# Patient Record
Sex: Female | Born: 1945 | Race: White | Hispanic: No | Marital: Married | Smoking: Former smoker
Health system: Southern US, Community
[De-identification: ages and names within clinical notes are randomized; demographics above are authoritative.]

## PROBLEM LIST (undated history)

## (undated) DIAGNOSIS — M810 Age-related osteoporosis without current pathological fracture: Secondary | ICD-10-CM

## (undated) HISTORY — PX: GALLBLADDER SURGERY: SHX652

## (undated) HISTORY — DX: Age-related osteoporosis without current pathological fracture: M81.0

## (undated) HISTORY — PX: ABDOMINAL HYSTERECTOMY: SHX81

---

## 1999-12-14 ENCOUNTER — Other Ambulatory Visit: Admission: RE | Admit: 1999-12-14 | Discharge: 1999-12-14 | Payer: Self-pay | Admitting: Podiatry

## 2000-09-09 ENCOUNTER — Encounter: Admission: RE | Admit: 2000-09-09 | Discharge: 2000-09-25 | Payer: Self-pay | Admitting: Family Medicine

## 2000-11-19 ENCOUNTER — Other Ambulatory Visit: Admission: RE | Admit: 2000-11-19 | Discharge: 2000-11-19 | Payer: Self-pay | Admitting: Obstetrics & Gynecology

## 2005-11-04 ENCOUNTER — Other Ambulatory Visit: Admission: RE | Admit: 2005-11-04 | Discharge: 2005-11-04 | Payer: Self-pay | Admitting: Obstetrics & Gynecology

## 2009-01-24 ENCOUNTER — Encounter: Admission: RE | Admit: 2009-01-24 | Discharge: 2009-03-21 | Payer: Self-pay | Admitting: Orthopedic Surgery

## 2013-01-04 ENCOUNTER — Other Ambulatory Visit: Payer: Self-pay | Admitting: Obstetrics & Gynecology

## 2013-01-04 DIAGNOSIS — R928 Other abnormal and inconclusive findings on diagnostic imaging of breast: Secondary | ICD-10-CM

## 2013-01-13 ENCOUNTER — Ambulatory Visit
Admission: RE | Admit: 2013-01-13 | Discharge: 2013-01-13 | Disposition: A | Discharge status: Home / Self Care | Payer: Medicare Other | Source: Ambulatory Visit | Attending: Obstetrics & Gynecology | Admitting: Obstetrics & Gynecology

## 2013-01-13 DIAGNOSIS — R928 Other abnormal and inconclusive findings on diagnostic imaging of breast: Secondary | ICD-10-CM

## 2013-07-21 ENCOUNTER — Other Ambulatory Visit: Payer: Self-pay | Admitting: Obstetrics & Gynecology

## 2013-07-21 DIAGNOSIS — N63 Unspecified lump in unspecified breast: Secondary | ICD-10-CM

## 2013-08-05 ENCOUNTER — Ambulatory Visit
Admission: RE | Admit: 2013-08-05 | Discharge: 2013-08-05 | Disposition: A | Discharge status: Home / Self Care | Payer: Medicare Other | Source: Ambulatory Visit | Attending: Obstetrics & Gynecology | Admitting: Obstetrics & Gynecology

## 2013-08-05 DIAGNOSIS — N63 Unspecified lump in unspecified breast: Secondary | ICD-10-CM

## 2013-10-14 ENCOUNTER — Encounter (INDEPENDENT_AMBULATORY_CARE_PROVIDER_SITE_OTHER): Payer: Self-pay

## 2013-10-14 ENCOUNTER — Ambulatory Visit (INDEPENDENT_AMBULATORY_CARE_PROVIDER_SITE_OTHER): Payer: Medicare Other | Admitting: *Deleted

## 2013-10-14 DIAGNOSIS — Z23 Encounter for immunization: Secondary | ICD-10-CM

## 2014-03-21 ENCOUNTER — Telehealth: Payer: Self-pay | Admitting: Family Medicine

## 2014-03-21 ENCOUNTER — Encounter: Payer: Self-pay | Admitting: Family Medicine

## 2014-03-21 ENCOUNTER — Ambulatory Visit (INDEPENDENT_AMBULATORY_CARE_PROVIDER_SITE_OTHER): Payer: Medicare Other | Admitting: Family Medicine

## 2014-03-21 VITALS — BP 108/62 | HR 76 | Temp 98.1°F | Ht 64.0 in | Wt 161.0 lb

## 2014-03-21 DIAGNOSIS — G5700 Lesion of sciatic nerve, unspecified lower limb: Secondary | ICD-10-CM

## 2014-03-21 MED ORDER — NAPROXEN 500 MG PO TABS: 500.0000 mg | ORAL_TABLET | Freq: Two times a day (BID) | ORAL | Status: DC

## 2014-03-21 NOTE — Patient Instructions (Signed)
Piriformis Syndrome with Rehab Piriformis syndrome is a condition the affects the nervous system in the area of the hip, and is characterized by pain and possibly a loss of feeling in the backside (posterior) thigh that may extend down the entire length of the leg. The symptoms are caused by an increase in pressure on the sciatic nerve by the piriformis muscle, which is on the back of the hip and is responsible for externally rotating the hip. The sciatic nerve and its branches connect to much of the leg. Normally the sciatic nerve runs between the piriformis muscle and other muscles. However, in certain individuals the nerve runs through the muscle, which causes an increase in pressure on the nerve and results in the symptoms of piriformis syndrome. SYMPTOMS   Pain, tingling, numbness, or burning in the back of the thigh that may also extend down the entire leg.  Occasionally, tenderness in the buttock.  Loss of function of the leg.  Pain that worsens when using the piriformis muscle (running, jumping, or stairs).  Pain that increases with prolonged sitting.  Pain that is lessened by laying flat on the back. CAUSES   Piriformis syndrome is the result of an increase in pressure placed on the sciatic nerve. Often times piriformis syndrome is an overuse injury.  Stress placed on the nerve from a sudden increase in the intensity, frequency, or duration of training.  Compensation of other extremity injuries. RISK INCREASES WITH:  Sports that involve the piriformis muscle (running, walking or jumping).  You are born with (congenital) a defect in which the sciatic nerve passes through the muscle. PREVENTION  Warm up and stretch properly before activity.  Allow for adequate recovery between workouts.  Maintain physical fitness:  Strength, flexibility, and endurance.  Cardiovascular fitness. PROGNOSIS  If treated properly, then the symptoms of piriformis syndrome usually resolve in 2  to 6 weeks. RELATED COMPLICATIONS   Persistent and possibly permanent pain and numbness in the lower extremity.  Weakness of the extremity that may progress to disability and inability to compete. TREATMENT  The most effective treatment for piriformis syndrome is rest from any activities that aggravate the symptoms. Ice and pain medication may help reduce pain and inflammation. The use of strengthening and stretching exercises may help reduce pain with activity. These exercises may be performed at home or with a therapist. A referral to a therapist may be given for further evaluation and treatment, such as ultrasound. Corticosteroid injections may be given to reduce inflammation that is causing pressure to be placed on the sciatic nerve. If non-surgical (conservative) treatment is unsuccessful, then surgery may be recommended.  MEDICATION   If pain medication is necessary, then nonsteroidal anti-inflammatory medications, such as aspirin and ibuprofen, or other minor pain relievers, such as acetaminophen, are often recommended.  Do not take pain medication for 7 days before surgery.  Prescription pain relievers may be given if deemed necessary by your caregiver. Use only as directed and only as much as you need.  Corticosteroid injections may be given by your caregiver. These injections should be reserved for the most serious cases, because they may only be given a certain number of times. HEAT AND COLD:   Cold treatment (icing) relieves pain and reduces inflammation. Cold treatment should be applied for 10 to 15 minutes every 2 to 3 hours for inflammation and pain and immediately after any activity that aggravates your symptoms. Use ice packs or massage the area with a piece of ice (ice massage).    Heat treatment may be used prior to performing the stretching and strengthening activities prescribed by your caregiver, physical therapist, or athletic trainer. Use a heat pack or soak the injury in  warm water. SEEK IMMEDIATE MEDICAL CARE IF:  Treatment seems to offer no benefit, or the condition worsens.  Any medications produce adverse side effects. EXERCISES RANGE OF MOTION (ROM) AND STRETCHING EXERCISES - Piriformis Syndrome These exercises may help you when beginning to rehabilitate your injury. Your symptoms may resolve with or without further involvement from your physician, physical therapist or athletic trainer. While completing these exercises, remember:   Restoring tissue flexibility helps normal motion to return to the joints. This allows healthier, less painful movement and activity.  An effective stretch should be held for at least 30 seconds.  A stretch should never be painful. You should only feel a gentle lengthening or release in the stretched tissue. STRETCH - Hip Rotators  Lie on your back on a firm surface. Grasp your right / left knee with your right / left hand and your ankle with your opposite hand.  Keeping your hips and shoulders firmly planted, gently pull your right / left knee and rotate your lower leg toward your opposite shoulder until you feel a stretch in your buttocks.  Hold this stretch for __________ seconds. Repeat this stretch __________ times. Complete this stretch __________ times per day. STRETCH  Iliotibial Band  On the floor or bed, lie on your side so your right / left leg is on top. Bend your knee and grab your ankle.  Slowly bring your knee back so that your thigh is in line with your trunk. Keep your heel at your buttocks and gently arch your back so your head, shoulders and hips line up.  Slowly lower your leg so that your knee approaches the floor/bed until you feel a gentle stretch on the outside of your right / left thigh. If you do not feel a stretch and your knee will not fall farther, place the heel of your opposite foot on top of your knee and pull your thigh down farther.  Hold this stretch for __________ seconds. Repeat  __________ times. Complete __________ times per day. STRENGTHENING EXERCISES - Piriformis Syndrome  These are some of the caregiver again or until your symptoms are resolved. Remember:   Strong muscles with good endurance tolerate stress better.  Do the exercises as initially prescribed by your caregiver. Progress slowly with each exercise, gradually increasing the number of repetitions and weight used under their guidance. STRENGTH - Hip Abductors, Straight Leg Raises Be aware of your form throughout the entire exercise so that you exercise the correct muscles. Sloppy form means that you are not strengthening the correct muscles.  Lie on your side so that your head, shoulders, knee and hip line up. You may bend your lower knee to help maintain your balance. Your right / left leg should be on top.  Roll your hips slightly forward, so that your hips are stacked directly over each other and your right / left knee is facing forward.  Lift your top leg up 4-6 inches, leading with your heel. Be sure that your foot does not drift forward or that your knee does not roll toward the ceiling.  Hold this position for __________ seconds. You should feel the muscles in your outer hip lifting (you may not notice this until your leg begins to tire).  Slowly lower your leg to the starting position. Allow the muscles to   fully relax before beginning the next repetition. Repeat __________ times. Complete this exercise __________ times per day.  STRENGTH - Hip Abductors, Quadriped  On a firm, lightly padded surface, position yourself on your hands and knees. Your hands should be directly below your shoulders and your knees should be directly below your hips.  Keeping your right / left knee bent, lift your leg out to the side. Keep your legs level and in line with your shoulders.  Position yourself on your hands and knees.  Hold for __________ seconds.  Keeping your trunk steady and your hips level, slowly  lower your leg to the starting position. Repeat __________ times. Complete this exercise __________ times per day.  STRENGTH - Hip Abductors, Standing  Tie one end of a rubber exercise band/tubing to a secure surface (table, pole) and tie a loop at the other end.  Place the loop around your right / left ankle. Keeping your ankle with the band directly opposite of the secured end, step away until there is tension in the tube/band.  Hold onto a chair as needed for balance.  Keeping your back upright, your shoulders over your hips, and your toes pointing forward, lift your right / left leg out to your side. Be sure to lift your leg with your hip muscles. Do not "throw" your leg or tip your body to lift your leg.  Slowly and with control, return to the starting position. Repeat exercise __________ times. Complete this exercise __________ times per day.  Document Released: 11/25/2005 Document Revised: 05/26/2012 Document Reviewed: 03/09/2009 [employer] Patient Information 2014 [employer]  

## 2014-03-21 NOTE — Progress Notes (Signed)
   Subjective:    Patient ID: [name], female    DOB: [birth year], 68 y.o.   MRN: [id number]  HPI This 68 y.o. female presents for evaluation of right hip pain and discomfort.  She c/o discomfort when She moves her right foot out laterally and internally.   Review of Systems C/o right hip pain   No chest pain, SOB, HA, dizziness, vision change, N/V, diarrhea, constipation, dysuria, urinary urgency or frequency or rash.  Objective:   Physical Exam  Vital signs noted  Well developed well nourished female.  HEENT - Head atraumatic Normocephalic Respiratory - Lungs CTA bilateral Cardiac - RRR S1 and S2 without murmur MS - TTP right gluteal region with palp and discomfort with internal and external rotation of hip.      Assessment & Plan:  Piriformis syndrome - Plan: naproxen (NAPROSYN) 500 MG tablet po bid x 10 - 15 days And use heating pad.  Follow up prn  Lysbeth Penner FNP

## 2014-03-21 NOTE — Telephone Encounter (Signed)
APPT WITH BILL [facility] AT 1

## 2014-03-31 ENCOUNTER — Telehealth: Payer: Self-pay | Admitting: Family Medicine

## 2014-04-25 LAB — HM DEXA SCAN

## 2014-05-03 ENCOUNTER — Encounter: Payer: Self-pay | Admitting: Family Medicine

## 2014-05-03 ENCOUNTER — Ambulatory Visit (INDEPENDENT_AMBULATORY_CARE_PROVIDER_SITE_OTHER): Payer: Medicare Other | Admitting: Family Medicine

## 2014-05-03 VITALS — BP 114/69 | HR 104 | Temp 99.3°F | Ht 64.0 in | Wt 163.4 lb

## 2014-05-03 DIAGNOSIS — R059 Cough, unspecified: Secondary | ICD-10-CM

## 2014-05-03 DIAGNOSIS — J029 Acute pharyngitis, unspecified: Secondary | ICD-10-CM

## 2014-05-03 DIAGNOSIS — R05 Cough: Secondary | ICD-10-CM

## 2014-05-03 DIAGNOSIS — J209 Acute bronchitis, unspecified: Secondary | ICD-10-CM

## 2014-05-03 LAB — POCT INFLUENZA A/B
Influenza A, POC: NEGATIVE
Influenza B, POC: NEGATIVE

## 2014-05-03 LAB — POCT RAPID STREP A (OFFICE): Rapid Strep A Screen: NEGATIVE

## 2014-05-03 MED ORDER — AZITHROMYCIN 250 MG PO TABS: ORAL_TABLET | ORAL | Status: DC

## 2014-05-03 MED ORDER — METHYLPREDNISOLONE ACETATE 80 MG/ML IJ SUSP
80.0000 mg | Freq: Once | INTRAMUSCULAR | Status: AC
  Administered 2014-05-03: 80 mg via INTRAMUSCULAR

## 2014-05-03 MED ORDER — BENZONATATE 100 MG PO CAPS: 100.0000 mg | ORAL_CAPSULE | Freq: Three times a day (TID) | ORAL | Status: DC | PRN

## 2014-05-03 NOTE — Progress Notes (Signed)
   Subjective:    Patient ID: [name], female    DOB: 01/06/1946, 68 y.o.   MRN: [id number]  HPI This 68 y.o. female presents for evaluation of aches, uri sx's, cough, and malaise for 2 days.   Review of Systems No chest pain, SOB, HA, dizziness, vision change, N/V, diarrhea, constipation, dysuria, urinary urgency or frequency, myalgias, arthralgias or rash.     Objective:   Physical Exam  Vital signs noted  Well developed well nourished female.  HEENT - Head atraumatic Normocephalic                Eyes - PERRLA, Conjuctiva - clear Sclera- Clear EOMI                Ears - EAC's Wnl TM's Wnl Gross Hearing WNL                Nose - Nares patent                 Throat - oropharanx wnl Respiratory - Lungs CTA bilateral Cardiac - RRR S1 and S2 without murmur GI - Abdomen soft Nontender and bowel sounds active x 4 Extremities - No edema. Neuro - Grossly intact.  Results for orders placed in visit on 05/03/14  POCT INFLUENZA A/B      Result Value Ref Range   Influenza A, POC Negative     Influenza B, POC Negative    POCT RAPID STREP A (OFFICE)      Result Value Ref Range   Rapid Strep A Screen Negative  Negative      Assessment & Plan:  Sore throat - Plan: POCT Influenza A/B, POCT rapid strep A, benzonatate (TESSALON PERLES) 100 MG capsule, azithromycin (ZITHROMAX) 250 MG tablet, methylPREDNISolone acetate (DEPO-MEDROL) injection 80 mg  Acute bronchitis - Plan: benzonatate (TESSALON PERLES) 100 MG capsule, azithromycin (ZITHROMAX) 250 MG tablet, methylPREDNISolone acetate (DEPO-MEDROL) injection 80 mg  Push po fluids, rest, tylenol and motrin otc prn as directed for fever, arthralgias, and myalgias.  Follow up prn if sx's continue or persist.  Lysbeth Penner FNP

## 2015-01-17 DIAGNOSIS — Z01419 Encounter for gynecological examination (general) (routine) without abnormal findings: Secondary | ICD-10-CM | POA: Diagnosis not present

## 2015-01-17 DIAGNOSIS — Z1231 Encounter for screening mammogram for malignant neoplasm of breast: Secondary | ICD-10-CM | POA: Diagnosis not present

## 2015-01-17 DIAGNOSIS — N959 Unspecified menopausal and perimenopausal disorder: Secondary | ICD-10-CM | POA: Diagnosis not present

## 2015-08-24 ENCOUNTER — Encounter (INDEPENDENT_AMBULATORY_CARE_PROVIDER_SITE_OTHER): Payer: Self-pay

## 2015-08-24 ENCOUNTER — Ambulatory Visit (INDEPENDENT_AMBULATORY_CARE_PROVIDER_SITE_OTHER): Payer: Medicare Other | Admitting: Family Medicine

## 2015-08-24 ENCOUNTER — Encounter: Payer: Self-pay | Admitting: Family Medicine

## 2015-08-24 VITALS — BP 115/73 | HR 85 | Temp 97.3°F | Ht 64.0 in | Wt 155.4 lb

## 2015-08-24 DIAGNOSIS — Z23 Encounter for immunization: Secondary | ICD-10-CM | POA: Diagnosis not present

## 2015-08-24 DIAGNOSIS — Z0000 Encounter for general adult medical examination without abnormal findings: Secondary | ICD-10-CM

## 2015-08-24 NOTE — Progress Notes (Signed)
Subjective:    Marissa Gomez is a 69 y.o. female who presents for Medicare Annual/Subsequent preventive examination.  Preventive Screening-Counseling & Management  Tobacco History  Smoking status  . Former Smoker -- 0.50 packs/day  . Types: Cigarettes  . Start date: 03/21/1989  . Quit date: 03/21/2009  Smokeless tobacco  . Not on file     Problems Prior to Visit 1. Osteoporosis- needs DEXA this year but gets it done at GYN  Current Problems (verified) There are no active problems to display for this patient.   Medications Prior to Visit Current Outpatient Prescriptions on File Prior to Visit  Medication Sig Dispense Refill  . alendronate (FOSAMAX) 70 MG tablet Take 70 mg by mouth once a week. Take with a full glass of water on an empty stomach.     No current facility-administered medications on file prior to visit.    Current Medications (verified) Current Outpatient Prescriptions  Medication Sig Dispense Refill  . alendronate (FOSAMAX) 70 MG tablet Take 70 mg by mouth once a week. Take with a full glass of water on an empty stomach.     No current facility-administered medications for this visit.     Allergies (verified) Penicillins   PAST HISTORY  Family History Family History  Problem Relation Age of Onset  . Heart disease Father     Social History Social History  Substance Use Topics  . Smoking status: Former Smoker -- 0.50 packs/day    Types: Cigarettes    Start date: 03/21/1989    Quit date: 03/21/2009  . Smokeless tobacco: Not on file  . Alcohol Use: No     Are there smokers in your home (other than you)? No  Risk Factors Current exercise habits: The patient does not participate in regular exercise at present.  Dietary issues discussed: None, tries to eat veggies   Cardiac risk factors: advanced age (older than 53 for men, 74 for women).  Depression Screen (Note: if answer to either of the following is "Yes", a more complete depression  screening is indicated)   Over the past two weeks, have you felt down, depressed or hopeless? No  Over the past two weeks, have you felt little interest or pleasure in doing things? No  Have you lost interest or pleasure in daily life? No  Do you often feel hopeless? No  Do you cry easily over simple problems? No  Activities of Daily Living In your present state of health, do you have any difficulty performing the following activities?:  Driving? No Managing money?  No Feeding yourself? No Getting from bed to chair? No Climbing a flight of stairs? No Preparing food and eating?: No Bathing or showering? No Getting dressed: No Getting to the toilet? No Using the toilet:No Moving around from place to place: No In the past year have you fallen or had a near fall?:No   Are you sexually active?  Yes  Do you have more than one partner?  No  Hearing Difficulties: No Do you often ask people to speak up or repeat themselves? No Do you experience ringing or noises in your ears? No Do you have difficulty understanding soft or whispered voices? No   Do you feel that you have a problem with memory? No  Do you often misplace items? No  Do you feel safe at home?  Yes  Cognitive Testing  Alert? Yes  Normal Appearance?Yes  Oriented to person? Yes  Place? Yes   Time? Yes  Recall  of three objects?  Yes  Can perform simple calculations? Yes  Displays appropriate judgment?Yes  Can read the correct time from a watch face?Yes   Advanced Directives have been discussed with the patient? Yes  List the Names of Other Physician/Practitioners you currently use: 1.    Indicate any recent Medical Services you may have received from other than [facility] providers in the past year (date may be approximate).  Immunization History  Administered Date(s) Administered  . Influenza,inj,Quad PF,36+ Mos 10/14/2013    Screening Tests Health Maintenance  Topic Date Due  . Hepatitis C Screening  [birth year]   . TETANUS/TDAP  01/29/1963  . ZOSTAVAX  01/30/2004  . DEXA SCAN  01/29/2009  . PNA vac Low Risk Adult (1 of 2 - PCV13) 01/29/2009  . INFLUENZA VACCINE  07/10/2015  . MAMMOGRAM  04/25/2016  . COLONOSCOPY  02/15/2024    All answers were reviewed with the patient and necessary referrals were made:  Kenn File, MD   08/24/2015   History reviewed: allergies, current medications, past family history, past medical history, past social history, past surgical history and problem list  Review of Systems Pertinent items are noted in HPI.    Objective:       Body mass index is 26.66 kg/(m^2). BP 115/73 mmHg  Pulse 85  Temp(Src) 97.3 F (36.3 C) (Oral)  Ht 5\' 4"  (1.626 m)  Wt 155 lb 6.4 oz (70.489 kg)  BMI 26.66 kg/m2  Gen: NAD, alert, cooperative with exam HEENT: NCAT,  L TM obscurred by cerumen, R TM normal with mod cerumen present CV: RRR, good S1/S2, no murmur Resp: CTABL, no wheezes, non-labored Ext: No edema, warm Neuro: Alert and oriented, No gross deficits      Assessment:     Marissa Gomez is a pleasant 69 year old female who sees her GYN for annual physicals. She has osteoipenia and is being treated with alendronate by her GYN. She has a follow up DEXA this year and will send [place] records.   Today she has impacted cerumen on teh L, She has debrox for this and will use it Difficulty sleeping- Bneadryl currently, encouraged melatonin      Plan:     During the course of the visit the patient was educated and counseled about appropriate screening and preventive services including:    Pneumococcal vaccine   advanced directive paperwork given  Diet review for nutrition referral? no   Patient Instructions (the written plan) was given to the patient.  Medicare Attestation I have personally reviewed: The patient's medical and social history Their use of alcohol, tobacco or illicit drugs Their current medications and supplements The patient's functional  ability including ADLs,fall risks, home safety risks, cognitive, and hearing and visual impairment Diet and physical activities Evidence for depression or mood disorders  The patient's weight, height, BMI, and visual acuity have been recorded in the chart.  I have made referrals, counseling, and provided education to the patient based on review of the above and I have provided the patient with a written personalized care plan for preventive services.     Kenn File, MD   08/24/2015

## 2015-08-24 NOTE — Patient Instructions (Signed)
For sleep try 5 mg of melatonin  Benadryl is also fine

## 2015-12-25 ENCOUNTER — Encounter: Payer: Self-pay | Admitting: Family Medicine

## 2015-12-25 ENCOUNTER — Ambulatory Visit (INDEPENDENT_AMBULATORY_CARE_PROVIDER_SITE_OTHER): Payer: Medicare Other | Admitting: Family Medicine

## 2015-12-25 VITALS — BP 104/65 | HR 89 | Temp 98.0°F | Ht 64.0 in | Wt 158.6 lb

## 2015-12-25 DIAGNOSIS — H6122 Impacted cerumen, left ear: Secondary | ICD-10-CM | POA: Diagnosis not present

## 2015-12-25 DIAGNOSIS — M533 Sacrococcygeal disorders, not elsewhere classified: Secondary | ICD-10-CM

## 2015-12-25 MED ORDER — MELOXICAM 7.5 MG PO TABS: 7.5000 mg | ORAL_TABLET | Freq: Every day | ORAL | Status: DC

## 2015-12-25 NOTE — Progress Notes (Signed)
BP 104/65 mmHg  Pulse 89  Temp(Src) 98 F (36.7 C) (Oral)  Ht 5\' 4"  (1.626 m)  Wt 158 lb 9.6 oz (71.94 kg)  BMI 27.21 kg/m2   Subjective:    Patient ID: Marissa Gomez, female    DOB: 1946/05/11, 70 y.o.   MRN: [id number]  HPI: Marissa Gomez is a 70 y.o. female presenting on 12/25/2015 for Lower back pain radiating into buttocks   HPI Left hip pain on the backside of her hip Patient has been having left hip pain on the backside of her hip for the past 2 months. It has been worsening over the past week to where she is having difficulty walking. It hurts more going upstairs and less going downstairs. She denies any fevers or chills or overlying skin changes. She has taken a couple Advil which may have helped. Rest definitely helps her get better. She had this once before in her right side but never on her left side.  Ear wax Patient has been complaining of significant ear wax problems on her left ear more than her right ear. She feels like it is muffled on her left ear and she has tried Diamox drops home without much success. She denies any significant drainage or purulence or pain associated with it. It is just more irritating and muffling of hearing.  Relevant past medical, surgical, family and social history reviewed and updated as indicated. Interim medical history since our last visit reviewed. Allergies and medications reviewed and updated.  Review of Systems  Constitutional: Negative for fever and chills.  HENT: Negative for congestion, ear discharge and ear pain.   Eyes: Negative for redness and visual disturbance.  Respiratory: Negative for chest tightness and shortness of breath.   Cardiovascular: Negative for chest pain, palpitations and leg swelling.  Genitourinary: Negative for dysuria and difficulty urinating.  Musculoskeletal: Positive for myalgias, arthralgias and gait problem. Negative for back pain and joint swelling.  Skin: Negative for rash.  Neurological:  Negative for light-headedness and headaches.  Psychiatric/Behavioral: Negative for behavioral problems and agitation.  All other systems reviewed and are negative.   Per HPI unless specifically indicated above     Medication List       This list is accurate as of: 12/25/15  2:31 PM.  Always use your most recent med list.               alendronate 70 MG tablet  Commonly known as:  FOSAMAX  Take 70 mg by mouth once a week. Take with a full glass of water on an empty stomach.     CALCIUM 1200 PO  Take by mouth.     CENTRUM SILVER ADULT 50+ PO  Take by mouth.     cholecalciferol 1000 units tablet  Commonly known as:  VITAMIN D  Take 1,000 Units by mouth daily.     meloxicam 7.5 MG tablet  Commonly known as:  MOBIC  Take 1 tablet (7.5 mg total) by mouth daily.     Red Yeast Rice 600 MG Caps  Take by mouth.           Objective:    BP 104/65 mmHg  Pulse 89  Temp(Src) 98 F (36.7 C) (Oral)  Ht 5\' 4"  (1.626 m)  Wt 158 lb 9.6 oz (71.94 kg)  BMI 27.21 kg/m2  Wt Readings from Last 3 Encounters:  12/25/15 158 lb 9.6 oz (71.94 kg)  08/24/15 155 lb 6.4 oz (70.489 kg)  05/03/14  163 lb 6.4 oz (74.118 kg)    Physical Exam  Constitutional: She is oriented to person, place, and time. She appears well-developed and well-nourished. No distress.  HENT:  Nose: Nose normal. Right sinus exhibits no maxillary sinus tenderness and no frontal sinus tenderness. Left sinus exhibits no maxillary sinus tenderness and no frontal sinus tenderness.  Mouth/Throat: Uvula is midline, oropharynx is clear and moist and mucous membranes are normal.  Cerumen impacted in left ear, significant cerumen in right ear as well but not impacted.  Eyes: Conjunctivae and EOM are normal. Pupils are equal, round, and reactive to light.  Cardiovascular: Normal rate, regular rhythm, normal heart sounds and intact distal pulses.   No murmur heard. Pulmonary/Chest: Effort normal and breath sounds normal. No  respiratory distress. She has no wheezes.  Musculoskeletal: Normal range of motion. She exhibits no edema or tenderness.       Left hip: Normal. She exhibits normal range of motion, normal strength, no tenderness, no bony tenderness, no swelling, no crepitus, no deformity and no laceration.       Back:  Neurological: She is alert and oriented to person, place, and time. Coordination normal.  Skin: Skin is warm and dry. No rash noted. She is not diaphoretic.  Psychiatric: She has a normal mood and affect. Her behavior is normal.  Nursing note and vitals reviewed.   Results for orders placed or performed in visit on 05/03/14  POCT Influenza A/B  Result Value Ref Range   Influenza A, POC Negative    Influenza B, POC Negative   POCT rapid strep A  Result Value Ref Range   Rapid Strep A Screen Negative Negative      Assessment & Plan:   Problem List Items Addressed This Visit    None    Visit Diagnoses    Cerumen impaction, left    -  Primary    Sacroiliac joint pain        Been going on for 2 months, will try Mobic and see if improves.    Relevant Medications    meloxicam (MOBIC) 7.5 MG tablet        Follow up plan: Return if symptoms worsen or fail to improve.  Counseling provided for all of the vaccine components No orders of the defined types were placed in this encounter.    Caryl Pina, MD [facility] 12/25/2015, 2:31 PM

## 2016-01-02 ENCOUNTER — Encounter: Payer: Self-pay | Admitting: *Deleted

## 2016-01-03 ENCOUNTER — Ambulatory Visit (INDEPENDENT_AMBULATORY_CARE_PROVIDER_SITE_OTHER): Payer: Medicare Other | Admitting: Family Medicine

## 2016-01-03 ENCOUNTER — Encounter: Payer: Self-pay | Admitting: Family Medicine

## 2016-01-03 VITALS — BP 105/68 | HR 86 | Temp 97.6°F | Ht 64.0 in | Wt 160.8 lb

## 2016-01-03 DIAGNOSIS — H6122 Impacted cerumen, left ear: Secondary | ICD-10-CM | POA: Diagnosis not present

## 2016-01-03 NOTE — Progress Notes (Signed)
BP 105/68 mmHg  Pulse 86  Temp(Src) 97.6 F (36.4 C) (Oral)  Ht 5\' 4"  (1.626 m)  Wt 160 lb 12.8 oz (72.938 kg)  BMI 27.59 kg/m2   Subjective:    Patient ID: Marissa Gomez, female    DOB: 01-Apr-1946, 70 y.o.   MRN: [id number]  HPI: Marissa Gomez is a 70 y.o. female presenting on 01/03/2016 for Ear wax removal   HPI Cerumen impaction Patient comes in today for a two-week follow-up on cerumen impaction. Her left ear is worse than her right. The last time she was here 2 weeks ago had difficulty getting it removed as it was so impacted and had her do 2 weeks of Debrox and come back today for a repeat washing and removal. She said her ears have still been muffled during this time but is improved slightly with the Debrox.  Relevant past medical, surgical, family and social history reviewed and updated as indicated. Interim medical history since our last visit reviewed. Allergies and medications reviewed and updated.  Review of Systems  Constitutional: Negative for fever and chills.  HENT: Positive for ear discharge. Negative for congestion and ear pain.   Eyes: Negative for redness and visual disturbance.  Respiratory: Negative for chest tightness and shortness of breath.   Cardiovascular: Negative for chest pain and leg swelling.  Genitourinary: Negative for dysuria and difficulty urinating.  Musculoskeletal: Negative for back pain and gait problem.  Skin: Negative for rash.  Neurological: Negative for light-headedness and headaches.  Psychiatric/Behavioral: Negative for behavioral problems and agitation.  All other systems reviewed and are negative.   Per HPI unless specifically indicated above     Medication List       This list is accurate as of: 01/03/16 10:50 AM.  Always use your most recent med list.               alendronate 70 MG tablet  Commonly known as:  FOSAMAX  Take 70 mg by mouth once a week. Take with a full glass of water on an empty stomach.     CALCIUM 1200 PO  Take by mouth.     CENTRUM SILVER ADULT 50+ PO  Take by mouth.     cholecalciferol 1000 units tablet  Commonly known as:  VITAMIN D  Take 1,000 Units by mouth daily.     meloxicam 7.5 MG tablet  Commonly known as:  MOBIC  Take 1 tablet (7.5 mg total) by mouth daily.     Red Yeast Rice 600 MG Caps  Take by mouth.           Objective:    BP 105/68 mmHg  Pulse 86  Temp(Src) 97.6 F (36.4 C) (Oral)  Ht 5\' 4"  (1.626 m)  Wt 160 lb 12.8 oz (72.938 kg)  BMI 27.59 kg/m2  Wt Readings from Last 3 Encounters:  01/03/16 160 lb 12.8 oz (72.938 kg)  12/25/15 158 lb 9.6 oz (71.94 kg)  08/24/15 155 lb 6.4 oz (70.489 kg)    Physical Exam  Constitutional: She is oriented to person, place, and time. She appears well-developed and well-nourished. No distress.  HENT:  Right Ear: Tympanic membrane, external ear and ear canal normal.  Left Ear: Tympanic membrane and external ear normal.  Examined after washing and lavage. Minimal wax left in left ear canal right is completely clear. Able to visualize tympanic membrane completely. No signs of infection.  Eyes: Conjunctivae and EOM are normal. Pupils are equal, round, and  reactive to light.  Cardiovascular: Normal rate, regular rhythm, normal heart sounds and intact distal pulses.   No murmur heard. Pulmonary/Chest: Effort normal and breath sounds normal. No respiratory distress. She has no wheezes.  Musculoskeletal: Normal range of motion. She exhibits no edema or tenderness.  Neurological: She is alert and oriented to person, place, and time. Coordination normal.  Skin: Skin is warm and dry. No rash noted. She is not diaphoretic.  Psychiatric: She has a normal mood and affect. Her behavior is normal.  Nursing note and vitals reviewed.   Results for orders placed or performed in visit on 01/02/16  HM DEXA SCAN  Result Value Ref Range   HM Dexa Scan No change    Cerumen disimpaction performed by the nurse, lavage. He  tolerated well and no side effects.    Assessment & Plan:       Problem List Items Addressed This Visit    None    Visit Diagnoses    Cerumen impaction, left    -  Primary    Patient was here for cerumen disimpaction 2 weeks ago, did not work, did 2 weeks of Debrox and will clean today        Follow up plan: Return if symptoms worsen or fail to improve.  Counseling provided for all of the vaccine components No orders of the defined types were placed in this encounter.    Caryl Pina, MD [facility] 01/03/2016, 10:50 AM

## 2016-01-25 ENCOUNTER — Telehealth: Payer: Self-pay | Admitting: Family Medicine

## 2016-01-25 DIAGNOSIS — M2550 Pain in unspecified joint: Secondary | ICD-10-CM

## 2016-01-25 NOTE — Telephone Encounter (Signed)
Patient aware referral has been made  

## 2016-01-25 NOTE — Telephone Encounter (Signed)
Go ahead and do a referral to orthopedics

## 2016-02-02 DIAGNOSIS — M533 Sacrococcygeal disorders, not elsewhere classified: Secondary | ICD-10-CM | POA: Diagnosis not present

## 2016-02-08 ENCOUNTER — Ambulatory Visit: Payer: Medicare Other | Attending: Physician Assistant | Admitting: Physical Therapy

## 2016-02-08 DIAGNOSIS — M533 Sacrococcygeal disorders, not elsewhere classified: Secondary | ICD-10-CM | POA: Diagnosis not present

## 2016-02-08 NOTE — Therapy (Signed)
[facility] [place], [place], [address] Phone: [phone]   Fax:  [phone]  Physical Therapy Evaluation  Patient Details  Name: Marissa Gomez MRN: [id number] Date of Birth: 08-14-1946 Referring Provider: Jeri Cos PA-C.  Encounter Date: 02/08/2016      PT End of Session - 02/08/16 1028    Visit Number 1   Number of Visits 12   Date for PT Re-Evaluation 03/21/16   PT Start Time 0947   PT Stop Time 1034   PT Time Calculation (min) 47 min   Activity Tolerance Patient tolerated treatment well   Behavior During Therapy [facility] for tasks assessed/performed      No past medical history on file.  Past Surgical History  Procedure Laterality Date  . Abdominal hysterectomy    . Gallbladder surgery      There were no vitals filed for this visit.  Visit Diagnosis:  Sacral back pain - Plan: PT plan of care cert/re-cert      Subjective Assessment - 02/08/16 0954    Subjective If I climb stairs my pain rises.   Limitations --  "Stairs."   Patient Stated Goals Get out of pain.   Pain Score 4    Pain Location Back   Pain Orientation Left   Pain Descriptors / Indicators Dull   Pain Type Acute pain   Pain Onset More than a month ago   Pain Frequency Constant   Aggravating Factors  Climbing stairs.   Pain Relieving Factors Lying down.            [facility] PT Assessment - 02/08/16 0001    Assessment   Medical Diagnosis Pain in left SIJ   Referring Provider Jeri Cos PA-C.   Onset Date/Surgical Date --  November 2016.   Hand Dominance Right   Next MD Visit PRN.   Precautions   Precautions None   Restrictions   Weight Bearing Restrictions No   Balance Screen   Has the patient fallen in the past 6 months No   Has the patient had a decrease in activity level because of a fear of falling?  No   Is the patient reluctant to leave their home because of a fear of falling?  No   Home Environment   Living Environment  Private residence   Prior Function   Level of Independence Independent   Posture/Postural Control   Posture Comments Left anterior pelvic tilt.  Left LE longer than right.   ROM / Strength   AROM / PROM / Strength AROM;Strength   AROM   Overall AROM Comments Normal spinal AROM.   Strength   Overall Strength Comments Normal bilateral LE strength.   Palpation   Palpation comment Very tender to palpation over patient's left SIJ.   Special Tests    Special Tests Lumbar   Lumbar Tests Straight Leg Raise  Norm LE DTR's (-) FABER testing.   Straight Leg Raise   Findings Negative   Ambulation/Gait   Gait Comments WNL.                   [facility] Adult PT Treatment/Exercise - 02/08/16 0001    Modalities   Modalities Electrical Stimulation;Moist Heat   Moist Heat Therapy   Number Minutes Moist Heat 15 Minutes   Moist Heat Location --  LB   Electrical Stimulation   Electrical Stimulation Location --  LB   Electrical Stimulation Action Constant pre-mod e'stim   Electrical Stimulation Parameters  80-150 HZ x 15 minutes.   Electrical Stimulation Goals Pain                PT Education - 02/19/16 1020    Education provided Yes   Person(s) Educated Patient   Methods Explanation;Demonstration;Handout;Verbal cues;Tactile cues   Comprehension Verbalized understanding;Returned demonstration          PT Short Term Goals - February 19, 2016 1026    PT SHORT TERM GOAL #1   Title Ind with an HEP.   Time 2   Period Weeks   Status New           PT Long Term Goals - 02-19-2016 1026    PT LONG TERM GOAL #1   Title Ind with an advanced core exercise program.   Time 4   Period Weeks   Status New   PT LONG TERM GOAL #2   Title Perform ADL's with pain not to exceed 2/10.   Time 4   Period Weeks   Status New   PT LONG TERM GOAL #3   Title Go up a flight of stairs with pain not > 2/10.   Time 4   Period Weeks   Status New               Plan - 2016-02-19 1023     Clinical Impression Statement The patient reports left sided low back pain since Novemeber 2016.  Her pain has improved since going on a steroid pack.  Her pain increases to 4/10 now when going up a flight of stairs.  Lying down decreases her pain.  I recommended she sleep with a pillow between her knees (she is a side sleeper.  She was shown 2 home exercises today.   Pt will benefit from skilled therapeutic intervention in order to improve on the following deficits Pain;Decreased activity tolerance   Rehab Potential Excellent   PT Frequency 3x / week   PT Duration 4 weeks   PT Treatment/Interventions ADLs/Self Care Home Management;Cryotherapy;Electrical Stimulation;Moist Heat;Therapeutic exercise;Therapeutic activities;Ultrasound;Manual techniques   PT Next Visit Plan Review HEP to equalize her leg lengths.  Prone over pillow Combo E'stim/U/S to patient left SIJ f/b STW/M; Back stabilization exercise to stab SIJ.   Consulted and Agree with Plan of Care Patient          G-Codes - 19-Feb-2016 1029    Functional Assessment Tool Used FOTO....36% limitation.   Functional Limitation Mobility: Walking and moving around   Mobility: Walking and Moving Around Current Status [phone]) At least 20 percent but less than 40 percent impaired, limited or restricted   Mobility: Walking and Moving Around Goal Status [phone]) At least 1 percent but less than 20 percent impaired, limited or restricted       Problem List There are no active problems to display for this patient.   Ifrah Vest, [place] MPT 02-19-2016, 10:34 AM  [facility] [address] [place], [place], [address] Phone: [phone]   Fax:  [phone]  Name: Marissa Gomez MRN: [id number] Date of Birth: 05/02/1946

## 2016-02-12 ENCOUNTER — Encounter: Payer: Self-pay | Admitting: Physical Therapy

## 2016-02-12 ENCOUNTER — Ambulatory Visit: Payer: Medicare Other | Admitting: Physical Therapy

## 2016-02-12 DIAGNOSIS — M533 Sacrococcygeal disorders, not elsewhere classified: Secondary | ICD-10-CM

## 2016-02-12 NOTE — Therapy (Signed)
[facility] [place], [place], [address] Phone: [phone]   Fax:  [phone]  Physical Therapy Treatment  Patient Details  Name: Marissa Gomez MRN: [id number] Date of Birth: [birth year] Referring Provider: Jeri Cos PA-C.  Encounter Date: 02/12/2016      PT End of Session - 02/12/16 0903    Visit Number 2   Number of Visits 12   Date for PT Re-Evaluation 03/21/16   PT Start Time 0903   PT Stop Time 0945   PT Time Calculation (min) 42 min   Activity Tolerance Patient tolerated treatment well   Behavior During Therapy [facility] for tasks assessed/performed      No past medical history on file.  Past Surgical History  Procedure Laterality Date  . Abdominal hysterectomy    . Gallbladder surgery      There were no vitals filed for this visit.  Visit Diagnosis:  Sacral back pain      Subjective Assessment - 02/12/16 0902    Subjective States that she felt pain getting out of car and moving certain way. Reports complaince with HEP.   Patient Stated Goals Get out of pain.   Currently in Pain? Yes   Pain Score 3    Pain Location Back   Pain Orientation Left;Lower   Pain Type Acute pain   Pain Onset More than a month ago            [facility] PT Assessment - 02/12/16 0001    Assessment   Medical Diagnosis Pain in left SIJ   Hand Dominance Right   Next MD Visit PRN.   Precautions   Precautions None   Restrictions   Weight Bearing Restrictions No                     [facility] Adult PT Treatment/Exercise - 02/12/16 0001    Modalities   Modalities Electrical Stimulation;Moist Heat;Ultrasound   Moist Heat Therapy   Number Minutes Moist Heat 15 Minutes   Moist Heat Location Lumbar Spine   Electrical Stimulation   Electrical Stimulation Location L Low back/ SIJ   Electrical Stimulation Action Pre-Mod   Electrical Stimulation Parameters 80-150 Hz x15 min   Electrical Stimulation Goals Pain    Ultrasound   Ultrasound Location L SI Joint   Ultrasound Parameters 1.5 w/cm2, 100%,1 mhz x10 min   Ultrasound Goals Pain   Manual Therapy   Manual Therapy Myofascial release   Myofascial Release MFR/STW to L SI joint and L lumbar paraspinals in prone over 2 pillows for comfort to decrease pain and soreness.                  PT Short Term Goals - 02/12/16 0934    PT SHORT TERM GOAL #1   Title Ind with an HEP.   Time 2   Period Weeks   Status Achieved           PT Long Term Goals - 02/12/16 0935    PT LONG TERM GOAL #1   Title Ind with an advanced core exercise program.   Time 4   Period Weeks   Status On-going   PT LONG TERM GOAL #2   Title Perform ADL's with pain not to exceed 2/10.   Time 4   Period Weeks   Status On-going   PT LONG TERM GOAL #3   Title Go up a flight of stairs with pain not > 2/10.   Time  4   Period Weeks   Status On-going               Plan - 02/12/16 0935    Clinical Impression Statement Patient tolerated today's treatment well with only complaints of soreness to palpation over the L SI joint. No abnormal tightness noted in L Glute and lumbar musculature today upon palpation with manual therapy. MFR was conducted primarily over the L SI joint today in prone over 2 pillows to assist in decreasing L SI joint inflammation and discomfort. Patient achieved ST HEP goal today upon report of complaince and all LT goals remain on-going secondary to pain with activities and not having started core strengthening program yet. Normal modalities response noted following removal of the modalities. Patient continues to have discomfort with standing following sitting and after driving for prolonged time per patient report although she states that steroid medication helped. Educated patient to continue HEP exercises and to try pillow between knees during sleep at night to further decrease L SI joint irritation as patient stated she had forgotten to try  pillow between knees during sleep. Denied L SI pain following today's treatment although she reported she was still tender to palpation at the L SI joint following today's treatment.   Pt will benefit from skilled therapeutic intervention in order to improve on the following deficits Pain;Decreased activity tolerance   Rehab Potential Excellent   PT Frequency 3x / week   PT Duration 4 weeks   PT Treatment/Interventions ADLs/Self Care Home Management;Cryotherapy;Electrical Stimulation;Moist Heat;Therapeutic exercise;Therapeutic activities;Ultrasound;Manual techniques   PT Next Visit Plan Continue with manual therapy and modalities over L SI Joint as symptoms dicate and progress with core strengthening per MPT POC.   Consulted and Agree with Plan of Care Patient        Problem List There are no active problems to display for this patient.   Wynelle Fanny, PTA 02/12/2016, 9:54 AM  [facility] [address] [place], [place], [address] Phone: [phone]   Fax:  [phone]  Name: Marissa Gomez MRN: [id number] Date of Birth: 1946-04-29

## 2016-02-15 ENCOUNTER — Ambulatory Visit: Payer: Medicare Other | Admitting: Physical Therapy

## 2016-02-15 ENCOUNTER — Encounter: Payer: Self-pay | Admitting: Physical Therapy

## 2016-02-15 DIAGNOSIS — M533 Sacrococcygeal disorders, not elsewhere classified: Secondary | ICD-10-CM | POA: Diagnosis not present

## 2016-02-15 NOTE — Therapy (Signed)
[facility] [place], [place], [address] Phone: [phone]   Fax:  [phone]  Physical Therapy Treatment  Patient Details  Name: Marissa Gomez MRN: [id number] Date of Birth: [birth year] Referring Provider: Jeri Cos PA-C.  Encounter Date: 02/15/2016      PT End of Session - 02/15/16 0933    Visit Number 3   Number of Visits 12   Date for PT Re-Evaluation 03/21/16   PT Start Time 0915   PT Stop Time 0957   PT Time Calculation (min) 42 min   Activity Tolerance Patient tolerated treatment well   Behavior During Therapy [facility] for tasks assessed/performed      History reviewed. No pertinent past medical history.  Past Surgical History  Procedure Laterality Date  . Abdominal hysterectomy    . Gallbladder surgery      There were no vitals filed for this visit.  Visit Diagnosis:  Sacral back pain      Subjective Assessment - 02/15/16 0928    Subjective patient reported some relief after last treatment   Patient Stated Goals Get out of pain.   Currently in Pain? Yes   Pain Score 3    Pain Location Back   Pain Orientation Lower;Left   Pain Descriptors / Indicators Sore   Pain Type Acute pain   Pain Onset More than a month ago   Pain Frequency Intermittent   Aggravating Factors  climbing stairs or getting up from a seated position   Pain Relieving Factors walking, rest                         [facility] Adult PT Treatment/Exercise - 02/15/16 0001    Moist Heat Therapy   Number Minutes Moist Heat 15 Minutes   Moist Heat Location Lumbar Spine   Electrical Stimulation   Electrical Stimulation Location L Low back/ SIJ   Electrical Stimulation Action premod   Electrical Stimulation Parameters 80-150hz    Electrical Stimulation Goals Pain   Ultrasound   Ultrasound Location Left SI Joint   Ultrasound Parameters 1.5w/cm2/100%/3mhx10min   Ultrasound Goals Pain   Manual Therapy   Manual Therapy  Myofascial release   Myofascial Release MFR/STW to L SI joint and L lumbar paraspinals i                  PT Short Term Goals - 02/12/16 0934    PT SHORT TERM GOAL #1   Title Ind with an HEP.   Time 2   Period Weeks   Status Achieved           PT Long Term Goals - 02/12/16 0935    PT LONG TERM GOAL #1   Title Ind with an advanced core exercise program.   Time 4   Period Weeks   Status On-going   PT LONG TERM GOAL #2   Title Perform ADL's with pain not to exceed 2/10.   Time 4   Period Weeks   Status On-going   PT LONG TERM GOAL #3   Title Go up a flight of stairs with pain not > 2/10.   Time 4   Period Weeks   Status On-going               Plan - 02/15/16 0949    Clinical Impression Statement Patient continues to progress with good response to treatment, patient has reported less soreness with palpation today. Patient doing HEP daily and is  ready to progress with core strengthening exercises to stabilize muscles in back. Pateint unable to meet any further goals due to pain deficits   Pt will benefit from skilled therapeutic intervention in order to improve on the following deficits Pain;Decreased activity tolerance   Rehab Potential Excellent   PT Frequency 3x / week   PT Duration 4 weeks   PT Treatment/Interventions ADLs/Self Care Home Management;Cryotherapy;Electrical Stimulation;Moist Heat;Therapeutic exercise;Therapeutic activities;Ultrasound;Manual techniques   PT Next Visit Plan Continue with modalities and  progress with core strengthening per MPT POC.   Consulted and Agree with Plan of Care Patient        Problem List There are no active problems to display for this patient.   Phillips Climes, PTA 02/15/2016, 10:04 AM  [facility] [place], [place], [address] Phone: [phone]   Fax:  [phone]  Name: Marissa Gomez MRN: [id number] Date of Birth: 1946-03-09

## 2016-02-19 ENCOUNTER — Encounter: Payer: Self-pay | Admitting: Physical Therapy

## 2016-02-19 ENCOUNTER — Ambulatory Visit: Payer: Medicare Other | Admitting: Physical Therapy

## 2016-02-19 DIAGNOSIS — M533 Sacrococcygeal disorders, not elsewhere classified: Secondary | ICD-10-CM

## 2016-02-19 NOTE — Patient Instructions (Signed)
Brushing Teeth    Place one foot on ledge and one hand on counter. Bend other knee slightly to keep back straight.  Copyright  VHI. All rights reserved.  Refrigerator   Squat with knees apart to reach lower shelves and drawers.   Copyright  VHI. All rights reserved.  Laundry [employer] down and hold basket close to stand. Use leg muscles to do the work.   Copyright  VHI. All rights reserved.  Housework - Vacuuming   Hold the vacuum with arm held at side. Step back and forth to move it, keeping head up. Avoid twisting.   Copyright  VHI. All rights reserved.  Housework - Wiping   Position yourself as close as possible to reach work surface. Avoid straining your back.   Copyright  VHI. All rights reserved.  Gardening - Mowing   Keep arms close to sides and walk with lawn mower.   Copyright  VHI. All rights reserved.  Sleeping on Side   Place pillow between knees. Use cervical support under neck and a roll around waist as needed.   Copyright  VHI. All rights reserved.  Log Roll   Lying on back, bend left knee and place left arm across chest. Roll all in one movement to the right. Reverse to roll to the left. Always move as one unit.   Copyright  VHI. All rights reserved.  Stand to Sit / Sit to Stand   To sit: Bend knees to lower self onto front edge of chair, then scoot back on seat. To stand: Reverse sequence by placing one foot forward, and scoot to front of seat. Use rocking motion to stand up.  Pelvic Tilt: Posterior - Legs Bent (Supine)   Tighten stomach and flatten back by rolling pelvis down. Hold _10___ seconds. Relax. Repeat _10-30___ times per set. Do __2__ sets per session. Do _2___ sessions per day.

## 2016-02-19 NOTE — Therapy (Signed)
[facility] [place], [place], [address] Phone: [phone]   Fax:  [phone]  Physical Therapy Treatment  Patient Details  Name: Marissa Gomez MRN: [id number] Date of Birth: [birth year] Referring Provider: Jeri Cos PA-C.  Encounter Date: 02/19/2016      PT End of Session - 02/19/16 0955    Visit Number 4   Number of Visits 12   Date for PT Re-Evaluation 03/21/16   PT Start Time 0915   PT Stop Time 0959   PT Time Calculation (min) 44 min   Activity Tolerance Patient tolerated treatment well   Behavior During Therapy [facility] for tasks assessed/performed      History reviewed. No pertinent past medical history.  Past Surgical History  Procedure Laterality Date  . Abdominal hysterectomy    . Gallbladder surgery      There were no vitals filed for this visit.  Visit Diagnosis:  Sacral back pain      Subjective Assessment - 02/19/16 0921    Subjective Patient had relief two days after treatment, then yesterday pain increased for unknown reason   Patient Stated Goals Get out of pain.   Currently in Pain? Yes   Pain Score 4    Pain Location Back   Pain Orientation Left;Lower   Pain Descriptors / Indicators Sore   Pain Type Acute pain   Pain Onset More than a month ago   Pain Frequency Intermittent   Aggravating Factors  climbing stairs or getting up from seated position   Pain Relieving Factors walking and rest                         [facility] Adult PT Treatment/Exercise - 02/19/16 0001    Self-Care   Self-Care ADL's;Lifting;Posture   Exercises   Exercises Lumbar   Lumbar Exercises: Supine   Ab Set 5 seconds  2x10   Bent Knee Raise 3 seconds  2x10   Moist Heat Therapy   Number Minutes Moist Heat 15 Minutes   Moist Heat Location Lumbar Spine   Electrical Stimulation   Electrical Stimulation Location L Low back/ SIJ   Electrical Stimulation Action premod   Electrical Stimulation  Parameters 80-150hz    Electrical Stimulation Goals Pain   Ultrasound   Ultrasound Location left SI Joint area   Ultrasound Parameters 1.5w/cm2/50%/27mhzx10min   Ultrasound Goals Pain                PT Education - 02/19/16 0948    Education provided Yes   Education Details core activation and posture techniques   Person(s) Educated Patient   Methods Explanation;Demonstration;Handout   Comprehension Verbalized understanding;Returned demonstration          PT Short Term Goals - 02/12/16 0934    PT SHORT TERM GOAL #1   Title Ind with an HEP.   Time 2   Period Weeks   Status Achieved           PT Long Term Goals - 02/12/16 0935    PT LONG TERM GOAL #1   Title Ind with an advanced core exercise program.   Time 4   Period Weeks   Status On-going   PT LONG TERM GOAL #2   Title Perform ADL's with pain not to exceed 2/10.   Time 4   Period Weeks   Status On-going   PT LONG TERM GOAL #3   Title Go up a flight of stairs with pain  not > 2/10.   Time 4   Period Weeks   Status On-going               Plan - 02/19/16 0956    Clinical Impression Statement Patient progressing with good understanding of core activation and posture awareness techniques today. Patient reported relief after therapy for two days then pain returns after a day of ADL's. HEP given for posture and core activation today.  Patient unable to meet any further goals due to pain limitations.   Pt will benefit from skilled therapeutic intervention in order to improve on the following deficits Pain;Decreased activity tolerance   Rehab Potential Excellent   PT Frequency 3x / week   PT Duration 4 weeks   PT Treatment/Interventions ADLs/Self Care Home Management;Cryotherapy;Electrical Stimulation;Moist Heat;Therapeutic exercise;Therapeutic activities;Ultrasound;Manual techniques   PT Next Visit Plan Continue with modalities and  progress with core strengthening per MPT POC.   Consulted and Agree  with Plan of Care Patient        Problem List There are no active problems to display for this patient.   Phillips Climes, PTA 02/19/2016, 10:02 AM  [facility] [place], [place], [address] Phone: [phone]   Fax:  [phone]  Name: Marissa Gomez MRN: [id number] Date of Birth: 16-May-1946

## 2016-02-22 ENCOUNTER — Ambulatory Visit: Payer: Medicare Other | Admitting: Physical Therapy

## 2016-02-22 ENCOUNTER — Encounter: Payer: Self-pay | Admitting: Physical Therapy

## 2016-02-22 DIAGNOSIS — M533 Sacrococcygeal disorders, not elsewhere classified: Secondary | ICD-10-CM

## 2016-02-22 NOTE — Therapy (Signed)
[facility] [place], [place], [address] Phone: [phone]   Fax:  [phone]  Physical Therapy Treatment  Patient Details  Name: Marissa Gomez MRN: [id number] Date of Birth: February 10, 1946 Referring Provider: Jeri Cos PA-C.  Encounter Date: 02/22/2016      PT End of Session - 02/22/16 0927    Visit Number 5   Number of Visits 12   Date for PT Re-Evaluation 03/21/16   PT Start Time 0915   PT Stop Time 0958   PT Time Calculation (min) 43 min   Activity Tolerance Patient tolerated treatment well   Behavior During Therapy [facility] for tasks assessed/performed      History reviewed. No pertinent past medical history.  Past Surgical History  Procedure Laterality Date  . Abdominal hysterectomy    . Gallbladder surgery      There were no vitals filed for this visit.  Visit Diagnosis:  Sacral back pain      Subjective Assessment - 02/22/16 0917    Subjective Patient reported less pain overall and no complaints after last treatment   Patient Stated Goals Get out of pain.   Currently in Pain? Yes   Pain Score 3    Pain Location Back   Pain Orientation Left;Lower   Pain Descriptors / Indicators Sore   Pain Type Acute pain   Pain Onset More than a month ago   Pain Frequency Intermittent   Aggravating Factors  climbing stairs or getting up from seated position   Pain Relieving Factors waking or rest                         [facility] Adult PT Treatment/Exercise - 02/22/16 0001    Lumbar Exercises: Stretches   Single Knee to Chest Stretch 3 reps;30 seconds   Piriformis Stretch 30 seconds;3 reps   Lumbar Exercises: Supine   Ab Set 20 reps;5 seconds   Bent Knee Raise 3 seconds  2x10 with holds   Bridge 3 seconds  2x10   Straight Leg Raise 3 seconds  2x10 with holds   Other Supine Lumbar Exercises hip add squeeze 2x10   Moist Heat Therapy   Number Minutes Moist Heat 15 Minutes   Moist Heat Location  Lumbar Spine   Electrical Stimulation   Electrical Stimulation Location L Low back/ SIJ   Electrical Stimulation Action premod   Electrical Stimulation Parameters 80-150hz    Electrical Stimulation Goals Pain   Ultrasound   Ultrasound Location left si joint   Ultrasound Parameters 1.5w/cm2/50%/18mhz x10   Ultrasound Goals Pain                  PT Short Term Goals - 02/12/16 0934    PT SHORT TERM GOAL #1   Title Ind with an HEP.   Time 2   Period Weeks   Status Achieved           PT Long Term Goals - 02/12/16 0935    PT LONG TERM GOAL #1   Title Ind with an advanced core exercise program.   Time 4   Period Weeks   Status On-going   PT LONG TERM GOAL #2   Title Perform ADL's with pain not to exceed 2/10.   Time 4   Period Weeks   Status On-going   PT LONG TERM GOAL #3   Title Go up a flight of stairs with pain not > 2/10.   Time 4  Period Weeks   Status On-going               Plan - 02/22/16 0931    Clinical Impression Statement Patient progressing with reports of less pain overall and feels 30% better overall. Patient still has difficulty with stairs and with ADL's due to pain in left SI Joint area. Patient continues to perform HEP daily. Patient has good understanding of posture and core strengthening. Unable to meet any further goals due to pain deficit.   Pt will benefit from skilled therapeutic intervention in order to improve on the following deficits Pain;Decreased activity tolerance   Rehab Potential Excellent   PT Frequency 3x / week   PT Duration 4 weeks   PT Treatment/Interventions ADLs/Self Care Home Management;Cryotherapy;Electrical Stimulation;Moist Heat;Therapeutic exercise;Therapeutic activities;Ultrasound;Manual techniques   PT Next Visit Plan Continue with modalities and  progress with core strengthening per MPT POC.   Consulted and Agree with Plan of Care Patient        Problem List There are no active problems to display for  this patient.   Phillips Climes, PTA 02/22/2016, 10:04 AM  [facility] [place], [place], [address] Phone: [phone]   Fax:  [phone]  Name: Marissa Gomez MRN: [id number] Date of Birth: 10/15/1946

## 2016-02-26 ENCOUNTER — Ambulatory Visit: Payer: Medicare Other | Admitting: Physical Therapy

## 2016-02-26 ENCOUNTER — Encounter: Payer: Self-pay | Admitting: Physical Therapy

## 2016-02-26 DIAGNOSIS — M533 Sacrococcygeal disorders, not elsewhere classified: Secondary | ICD-10-CM

## 2016-02-26 NOTE — Therapy (Addendum)
[facility] [place], [place], [address] Phone: [phone]   Fax:  [phone]  Physical Therapy Treatment  Patient Details  Name: Marissa Gomez MRN: [id number] Date of Birth: June 02, 1946 Referring Provider: Jeri Cos PA-C.  Encounter Date: 02/26/2016      PT End of Session - 02/26/16 1123    Visit Number 6   Number of Visits 12   Date for PT Re-Evaluation 03/21/16   PT Start Time 1114   PT Stop Time 1200   PT Time Calculation (min) 46 min   Activity Tolerance Patient tolerated treatment well   Behavior During Therapy [facility] for tasks assessed/performed      History reviewed. No pertinent past medical history.  Past Surgical History  Procedure Laterality Date  . Abdominal hysterectomy    . Gallbladder surgery      There were no vitals filed for this visit.  Visit Diagnosis:  Sacral back pain      Subjective Assessment - 02/26/16 1119    Subjective Patient reported constant pain with walking or standing   Patient Stated Goals Get out of pain.   Currently in Pain? Yes   Pain Score 4    Pain Location Back   Pain Orientation Left;Lower   Pain Descriptors / Indicators Sore   Pain Type Acute pain   Pain Onset More than a month ago   Pain Frequency Intermittent   Aggravating Factors  climbing stairs, standing or walking   Pain Relieving Factors rest                         [facility] Adult PT Treatment/Exercise - 02/26/16 0001    Moist Heat Therapy   Number Minutes Moist Heat 15 Minutes   Moist Heat Location Lumbar Spine   Electrical Stimulation   Electrical Stimulation Location L Low back/ SIJ   Electrical Stimulation Action premod   Electrical Stimulation Parameters 80-150hz    Electrical Stimulation Goals Pain   Ultrasound   Ultrasound Location left SI joint area   Ultrasound Parameters 1.5w/cm2/50%/74mzx10min   Ultrasound Goals Pain   Manual Therapy   Manual Therapy Myofascial release    Myofascial Release MFR/STW to L SI joint and L lumbar paraspinals i                  PT Short Term Goals - 02/12/16 0934    PT SHORT TERM GOAL #1   Title Ind with an HEP.   Time 2   Period Weeks   Status Achieved           PT Long Term Goals - 02/12/16 0935    PT LONG TERM GOAL #1   Title Ind with an advanced core exercise program.   Time 4   Period Weeks   Status On-going   PT LONG TERM GOAL #2   Title Perform ADL's with pain not to exceed 2/10.   Time 4   Period Weeks   Status On-going   PT LONG TERM GOAL #3   Title Go up a flight of stairs with pain not > 2/10.   Time 4   Period Weeks   Status On-going               Plan - 02/26/16 1124    Clinical Impression Statement Patient reported feeling 75% better from therapy yet now pain is back and feels 0% improvement. Patient was down to 2/10 pain when having flare up  and now its up to 4/10 pain. Patient reports doing HEP daily and cant recall anything that would cause flare ups. Unable to progress patient with advance core strengthening program due to pain level today. Paient undertands importance of core activation techniques and stabilizaton exercises. Patient unable to meet any further goals today due to pain limitations.    Pt will benefit from skilled therapeutic intervention in order to improve on the following deficits Pain;Decreased activity tolerance   Rehab Potential Excellent   PT Frequency 3x / week   PT Duration 4 weeks   PT Treatment/Interventions ADLs/Self Care Home Management;Cryotherapy;Electrical Stimulation;Moist Heat;Therapeutic exercise;Therapeutic activities;Ultrasound;Manual techniques   PT Next Visit Plan cont with POC per MD Margarita Grizzle 02/26/16)   Consulted and Agree with Plan of Care Patient        Problem List There are no active problems to display for this patient.   APPLEGATE, [place], PTA 02/26/2016, 12:11 PM  Ladean Raya, PTA 02/26/2016 12:11 PM  [facility] [address] [place], [place], [address] Phone: [phone]   Fax:  [phone]  Name: Marissa Gomez MRN: [id number] Date of Birth: 1946/08/06  PHYSICAL THERAPY DISCHARGE SUMMARY  Visits from Start of Care: 6.  Current functional level related to goals / functional outcomes: See above.   Remaining deficits: Good progress though continued pain.   Education / Equipment: HEP. Plan: Patient agrees to discharge.  Patient goals were not met. Patient is being discharged due to being pleased with the current functional level.  ?????         Mali Applegate MPT

## 2016-02-29 ENCOUNTER — Encounter: Payer: Medicare Other | Admitting: Physical Therapy

## 2016-03-04 DIAGNOSIS — Z6825 Body mass index (BMI) 25.0-25.9, adult: Secondary | ICD-10-CM | POA: Diagnosis not present

## 2016-03-04 DIAGNOSIS — R5383 Other fatigue: Secondary | ICD-10-CM | POA: Diagnosis not present

## 2016-03-04 DIAGNOSIS — Z1231 Encounter for screening mammogram for malignant neoplasm of breast: Secondary | ICD-10-CM | POA: Diagnosis not present

## 2016-03-04 DIAGNOSIS — Z01419 Encounter for gynecological examination (general) (routine) without abnormal findings: Secondary | ICD-10-CM | POA: Diagnosis not present

## 2016-03-14 DIAGNOSIS — M532X8 Spinal instabilities, sacral and sacrococcygeal region: Secondary | ICD-10-CM | POA: Diagnosis not present

## 2016-04-02 DIAGNOSIS — M5442 Lumbago with sciatica, left side: Secondary | ICD-10-CM | POA: Diagnosis not present

## 2016-04-02 DIAGNOSIS — G8929 Other chronic pain: Secondary | ICD-10-CM | POA: Diagnosis not present

## 2016-04-02 DIAGNOSIS — M5136 Other intervertebral disc degeneration, lumbar region: Secondary | ICD-10-CM | POA: Diagnosis not present

## 2016-04-12 DIAGNOSIS — M5136 Other intervertebral disc degeneration, lumbar region: Secondary | ICD-10-CM | POA: Diagnosis not present

## 2016-04-18 DIAGNOSIS — M5136 Other intervertebral disc degeneration, lumbar region: Secondary | ICD-10-CM | POA: Diagnosis not present

## 2016-04-18 DIAGNOSIS — G8929 Other chronic pain: Secondary | ICD-10-CM | POA: Diagnosis not present

## 2016-04-18 DIAGNOSIS — M5442 Lumbago with sciatica, left side: Secondary | ICD-10-CM | POA: Diagnosis not present

## 2016-06-12 DIAGNOSIS — M5442 Lumbago with sciatica, left side: Secondary | ICD-10-CM | POA: Diagnosis not present

## 2016-07-09 ENCOUNTER — Encounter: Payer: Self-pay | Admitting: Family Medicine

## 2016-07-17 DIAGNOSIS — M5136 Other intervertebral disc degeneration, lumbar region: Secondary | ICD-10-CM | POA: Diagnosis not present

## 2016-07-17 DIAGNOSIS — M5442 Lumbago with sciatica, left side: Secondary | ICD-10-CM | POA: Diagnosis not present

## 2016-09-19 DIAGNOSIS — M8589 Other specified disorders of bone density and structure, multiple sites: Secondary | ICD-10-CM | POA: Diagnosis not present

## 2016-10-16 ENCOUNTER — Ambulatory Visit (INDEPENDENT_AMBULATORY_CARE_PROVIDER_SITE_OTHER): Payer: Medicare Other

## 2016-10-16 DIAGNOSIS — Z23 Encounter for immunization: Secondary | ICD-10-CM

## 2016-11-29 ENCOUNTER — Encounter: Payer: Self-pay | Admitting: Family Medicine

## 2016-11-29 ENCOUNTER — Ambulatory Visit (INDEPENDENT_AMBULATORY_CARE_PROVIDER_SITE_OTHER): Payer: Medicare Other | Admitting: Family Medicine

## 2016-11-29 VITALS — BP 122/67 | HR 86 | Temp 97.5°F | Ht 64.0 in | Wt 162.0 lb

## 2016-11-29 DIAGNOSIS — B372 Candidiasis of skin and nail: Secondary | ICD-10-CM

## 2016-11-29 MED ORDER — FLUCONAZOLE 150 MG PO TABS: 150.0000 mg | ORAL_TABLET | ORAL | 0 refills | Status: DC

## 2016-11-29 NOTE — Progress Notes (Signed)
BP 122/67   Pulse 86   Temp 97.5 F (36.4 C) (Oral)   Ht 5\' 4"  (1.626 m)   Wt 162 lb (73.5 kg)   BMI 27.81 kg/m    Subjective:    Patient ID: Marissa Gomez, female    DOB: [birth year], 70 y.o.   MRN: [id number]  HPI: Marissa Gomez is a 70 y.o. female presenting on 11/29/2016 for Itchy rash on pubic area (has tried Monistat topically and vaginally)   HPI Rash in pubic area and around rectum Patient has been having for about a week a rash in her pubic region and around her rectum on her skin. She says the rash is very pruritic and irritated and she has been using topical vaginal creams such as Monistat intravaginally and on her skin checkup helped some but it has persisted despite that. She denies any purulence or erythema or drainage from the sites.  Relevant past medical, surgical, family and social history reviewed and updated as indicated. Interim medical history since our last visit reviewed. Allergies and medications reviewed and updated.  Review of Systems  Constitutional: Negative for chills and fever.  Respiratory: Negative for chest tightness and shortness of breath.   Cardiovascular: Negative for chest pain and leg swelling.  Genitourinary: Negative for difficulty urinating and dysuria.  Musculoskeletal: Negative for back pain and gait problem.  Skin: Positive for rash.  Neurological: Negative for light-headedness and headaches.  Psychiatric/Behavioral: Negative for agitation and behavioral problems.  All other systems reviewed and are negative.   Per HPI unless specifically indicated above      Objective:    BP 122/67   Pulse 86   Temp 97.5 F (36.4 C) (Oral)   Ht 5\' 4"  (1.626 m)   Wt 162 lb (73.5 kg)   BMI 27.81 kg/m   Wt Readings from Last 3 Encounters:  11/29/16 162 lb (73.5 kg)  01/03/16 160 lb 12.8 oz (72.9 kg)  12/25/15 158 lb 9.6 oz (71.9 kg)    Physical Exam  Constitutional: She is oriented to person, place, and time. She appears  well-developed and well-nourished. No distress.  Eyes: Conjunctivae are normal.  Cardiovascular: Normal rate, regular rhythm, normal heart sounds and intact distal pulses.   No murmur heard. Pulmonary/Chest: Effort normal and breath sounds normal. No respiratory distress. She has no wheezes.  Musculoskeletal: Normal range of motion. She exhibits no edema or tenderness.  Neurological: She is alert and oriented to person, place, and time. Coordination normal.  Skin: Skin is warm and dry. Rash noted. Rash is maculopapular (Pink maculopapular rash with irritation to the skin and satellite lesions, consistent with yeast dermatitis. Surrounding her labia majora and in the intragluteal perirectal region). She is not diaphoretic.  Psychiatric: She has a normal mood and affect. Her behavior is normal.  Nursing note and vitals reviewed.      Assessment & Plan:   Problem List Items Addressed This Visit    None    Visit Diagnoses    Yeast dermatitis    -  Primary   Has been using topical creams and it seems to not be resolving. Diflucan   Relevant Medications   fluconazole (DIFLUCAN) 150 MG tablet       Follow up plan: Return if symptoms worsen or fail to improve.  Counseling provided for all of the vaccine components No orders of the defined types were placed in this encounter.   Caryl Pina, MD [facility] 11/29/2016, 1:14 PM

## 2016-12-14 ENCOUNTER — Ambulatory Visit (INDEPENDENT_AMBULATORY_CARE_PROVIDER_SITE_OTHER): Payer: Medicare Other | Admitting: Family Medicine

## 2016-12-14 ENCOUNTER — Encounter: Payer: Self-pay | Admitting: Family Medicine

## 2016-12-14 VITALS — BP 112/66 | HR 100 | Temp 98.4°F | Ht 64.0 in | Wt 161.2 lb

## 2016-12-14 DIAGNOSIS — J029 Acute pharyngitis, unspecified: Secondary | ICD-10-CM | POA: Diagnosis not present

## 2016-12-14 NOTE — Progress Notes (Signed)
BP 112/66   Pulse 100   Temp 98.4 F (36.9 C) (Oral)   Ht 5\' 4"  (1.626 m)   Wt 161 lb 4 oz (73.1 kg)   BMI 27.68 kg/m    Subjective:    Patient ID: Marissa Gomez, female    DOB: 30-May-1946, 71 y.o.   MRN: [id number]  HPI: Marissa Gomez is a 71 y.o. female presenting on 12/14/2016 for Sinusitis (sinus congestion, pressure and drainage, sore throat, runny nose; symptoms began 2 days ago)   HPI Sinus congestion and sore throat Patient has been having sinus congestion and pressure and sore throat and drainage that began 2 days ago. She also awoke 2 days ago with a fever of 100.4 but has not had any fevers above 99 since that time. She took some Tylenol sinus medication. She says she is mostly concerned because she does get strep throat frequently and wants to know if that's what she has. She denies any major cough or coughing spells. She has not noticed any lumps or bumps in her neck that are new. She denies any sick contacts that she knows of.  Relevant past medical, surgical, family and social history reviewed and updated as indicated. Interim medical history since our last visit reviewed. Allergies and medications reviewed and updated.  Review of Systems  Constitutional: Negative for chills and fever.  HENT: Positive for congestion, postnasal drip, rhinorrhea, sinus pressure, sneezing and sore throat. Negative for ear discharge and ear pain.   Eyes: Negative for pain, redness and visual disturbance.  Respiratory: Negative for cough, chest tightness and shortness of breath.   Cardiovascular: Negative for chest pain and leg swelling.  Genitourinary: Negative for difficulty urinating and dysuria.  Musculoskeletal: Negative for back pain and gait problem.  Skin: Negative for rash.  Neurological: Negative for light-headedness and headaches.  Psychiatric/Behavioral: Negative for agitation and behavioral problems.  All other systems reviewed and are negative.   Per HPI unless  specifically indicated above      Objective:    BP 112/66   Pulse 100   Temp 98.4 F (36.9 C) (Oral)   Ht 5\' 4"  (1.626 m)   Wt 161 lb 4 oz (73.1 kg)   BMI 27.68 kg/m   Wt Readings from Last 3 Encounters:  12/14/16 161 lb 4 oz (73.1 kg)  11/29/16 162 lb (73.5 kg)  01/03/16 160 lb 12.8 oz (72.9 kg)    Physical Exam  Constitutional: She is oriented to person, place, and time. She appears well-developed and well-nourished. No distress.  HENT:  Right Ear: Tympanic membrane, external ear and ear canal normal.  Left Ear: Tympanic membrane, external ear and ear canal normal.  Nose: Mucosal edema and rhinorrhea present. No epistaxis. Right sinus exhibits no maxillary sinus tenderness and no frontal sinus tenderness. Left sinus exhibits no maxillary sinus tenderness and no frontal sinus tenderness.  Mouth/Throat: Uvula is midline and mucous membranes are normal. Posterior oropharyngeal edema and posterior oropharyngeal erythema present. No oropharyngeal exudate or tonsillar abscesses.  Eyes: Conjunctivae are normal.  Neck: Neck supple. No thyromegaly present.  Cardiovascular: Normal rate, regular rhythm, normal heart sounds and intact distal pulses.   No murmur heard. Pulmonary/Chest: Effort normal and breath sounds normal. No respiratory distress. She has no wheezes. She has no rales.  Musculoskeletal: Normal range of motion. She exhibits no edema or tenderness.  Lymphadenopathy:    She has no cervical adenopathy.  Neurological: She is alert and oriented to person, place, and time.  Coordination normal.  Skin: Skin is warm and dry. No rash noted. She is not diaphoretic.  Psychiatric: She has a normal mood and affect. Her behavior is normal.  Vitals reviewed.   Rapid strep test:Negative    Assessment & Plan:   Problem List Items Addressed This Visit    None    Visit Diagnoses    Acute pharyngitis, unspecified etiology    -  Primary   Relevant Orders   Rapid strep screen (not at  [facility])     Recommended Flonase and an antihistamine and Mucinex and nasal saline sprays and call back if worsens or does not improve   Follow up plan: Return if symptoms worsen or fail to improve.  Counseling provided for all of the vaccine components Orders Placed This Encounter  Procedures  . Rapid strep screen (not at [facility])    Caryl Pina, MD [facility] 12/14/2016, 9:04 AM

## 2016-12-16 ENCOUNTER — Telehealth: Payer: Self-pay | Admitting: Family Medicine

## 2016-12-16 LAB — CULTURE, GROUP A STREP

## 2016-12-16 LAB — RAPID STREP SCREEN (MED CTR MEBANE ONLY): STREP GP A AG, IA W/REFLEX: NEGATIVE

## 2016-12-16 MED ORDER — AZITHROMYCIN 250 MG PO TABS: ORAL_TABLET | ORAL | 0 refills | Status: DC

## 2016-12-16 NOTE — Telephone Encounter (Signed)
Pt aware rx sent to the pharmacy and pt is aware.

## 2017-02-27 DIAGNOSIS — G8929 Other chronic pain: Secondary | ICD-10-CM | POA: Diagnosis not present

## 2017-02-27 DIAGNOSIS — M5136 Other intervertebral disc degeneration, lumbar region: Secondary | ICD-10-CM | POA: Diagnosis not present

## 2017-02-27 DIAGNOSIS — M5442 Lumbago with sciatica, left side: Secondary | ICD-10-CM | POA: Diagnosis not present

## 2017-03-10 DIAGNOSIS — Z008 Encounter for other general examination: Secondary | ICD-10-CM | POA: Diagnosis not present

## 2017-03-10 DIAGNOSIS — Z1231 Encounter for screening mammogram for malignant neoplasm of breast: Secondary | ICD-10-CM | POA: Diagnosis not present

## 2017-03-10 DIAGNOSIS — Z01419 Encounter for gynecological examination (general) (routine) without abnormal findings: Secondary | ICD-10-CM | POA: Diagnosis not present

## 2017-04-16 DIAGNOSIS — M9905 Segmental and somatic dysfunction of pelvic region: Secondary | ICD-10-CM | POA: Diagnosis not present

## 2017-04-16 DIAGNOSIS — M5430 Sciatica, unspecified side: Secondary | ICD-10-CM | POA: Diagnosis not present

## 2017-04-16 DIAGNOSIS — M9903 Segmental and somatic dysfunction of lumbar region: Secondary | ICD-10-CM | POA: Diagnosis not present

## 2017-04-16 DIAGNOSIS — M9904 Segmental and somatic dysfunction of sacral region: Secondary | ICD-10-CM | POA: Diagnosis not present

## 2017-04-17 DIAGNOSIS — M9905 Segmental and somatic dysfunction of pelvic region: Secondary | ICD-10-CM | POA: Diagnosis not present

## 2017-04-17 DIAGNOSIS — M9903 Segmental and somatic dysfunction of lumbar region: Secondary | ICD-10-CM | POA: Diagnosis not present

## 2017-04-17 DIAGNOSIS — M9904 Segmental and somatic dysfunction of sacral region: Secondary | ICD-10-CM | POA: Diagnosis not present

## 2017-04-17 DIAGNOSIS — M5430 Sciatica, unspecified side: Secondary | ICD-10-CM | POA: Diagnosis not present

## 2017-04-21 DIAGNOSIS — M9904 Segmental and somatic dysfunction of sacral region: Secondary | ICD-10-CM | POA: Diagnosis not present

## 2017-04-21 DIAGNOSIS — M9903 Segmental and somatic dysfunction of lumbar region: Secondary | ICD-10-CM | POA: Diagnosis not present

## 2017-04-21 DIAGNOSIS — M5430 Sciatica, unspecified side: Secondary | ICD-10-CM | POA: Diagnosis not present

## 2017-04-21 DIAGNOSIS — M9905 Segmental and somatic dysfunction of pelvic region: Secondary | ICD-10-CM | POA: Diagnosis not present

## 2017-04-23 DIAGNOSIS — M9904 Segmental and somatic dysfunction of sacral region: Secondary | ICD-10-CM | POA: Diagnosis not present

## 2017-04-23 DIAGNOSIS — M5430 Sciatica, unspecified side: Secondary | ICD-10-CM | POA: Diagnosis not present

## 2017-04-23 DIAGNOSIS — M9903 Segmental and somatic dysfunction of lumbar region: Secondary | ICD-10-CM | POA: Diagnosis not present

## 2017-04-23 DIAGNOSIS — M9905 Segmental and somatic dysfunction of pelvic region: Secondary | ICD-10-CM | POA: Diagnosis not present

## 2017-04-24 DIAGNOSIS — M9904 Segmental and somatic dysfunction of sacral region: Secondary | ICD-10-CM | POA: Diagnosis not present

## 2017-04-24 DIAGNOSIS — M9903 Segmental and somatic dysfunction of lumbar region: Secondary | ICD-10-CM | POA: Diagnosis not present

## 2017-04-24 DIAGNOSIS — M9905 Segmental and somatic dysfunction of pelvic region: Secondary | ICD-10-CM | POA: Diagnosis not present

## 2017-04-24 DIAGNOSIS — M5430 Sciatica, unspecified side: Secondary | ICD-10-CM | POA: Diagnosis not present

## 2017-04-28 DIAGNOSIS — M9903 Segmental and somatic dysfunction of lumbar region: Secondary | ICD-10-CM | POA: Diagnosis not present

## 2017-04-28 DIAGNOSIS — M9905 Segmental and somatic dysfunction of pelvic region: Secondary | ICD-10-CM | POA: Diagnosis not present

## 2017-04-28 DIAGNOSIS — M5430 Sciatica, unspecified side: Secondary | ICD-10-CM | POA: Diagnosis not present

## 2017-04-28 DIAGNOSIS — M9904 Segmental and somatic dysfunction of sacral region: Secondary | ICD-10-CM | POA: Diagnosis not present

## 2017-04-30 DIAGNOSIS — M5430 Sciatica, unspecified side: Secondary | ICD-10-CM | POA: Diagnosis not present

## 2017-04-30 DIAGNOSIS — M9904 Segmental and somatic dysfunction of sacral region: Secondary | ICD-10-CM | POA: Diagnosis not present

## 2017-04-30 DIAGNOSIS — M9905 Segmental and somatic dysfunction of pelvic region: Secondary | ICD-10-CM | POA: Diagnosis not present

## 2017-04-30 DIAGNOSIS — M9903 Segmental and somatic dysfunction of lumbar region: Secondary | ICD-10-CM | POA: Diagnosis not present

## 2017-05-01 DIAGNOSIS — M5430 Sciatica, unspecified side: Secondary | ICD-10-CM | POA: Diagnosis not present

## 2017-05-01 DIAGNOSIS — M9905 Segmental and somatic dysfunction of pelvic region: Secondary | ICD-10-CM | POA: Diagnosis not present

## 2017-05-01 DIAGNOSIS — M9904 Segmental and somatic dysfunction of sacral region: Secondary | ICD-10-CM | POA: Diagnosis not present

## 2017-05-01 DIAGNOSIS — M9903 Segmental and somatic dysfunction of lumbar region: Secondary | ICD-10-CM | POA: Diagnosis not present

## 2017-05-06 DIAGNOSIS — M9905 Segmental and somatic dysfunction of pelvic region: Secondary | ICD-10-CM | POA: Diagnosis not present

## 2017-05-06 DIAGNOSIS — M9903 Segmental and somatic dysfunction of lumbar region: Secondary | ICD-10-CM | POA: Diagnosis not present

## 2017-05-06 DIAGNOSIS — M9904 Segmental and somatic dysfunction of sacral region: Secondary | ICD-10-CM | POA: Diagnosis not present

## 2017-05-06 DIAGNOSIS — M5430 Sciatica, unspecified side: Secondary | ICD-10-CM | POA: Diagnosis not present

## 2017-05-07 DIAGNOSIS — M5430 Sciatica, unspecified side: Secondary | ICD-10-CM | POA: Diagnosis not present

## 2017-05-07 DIAGNOSIS — M9905 Segmental and somatic dysfunction of pelvic region: Secondary | ICD-10-CM | POA: Diagnosis not present

## 2017-05-07 DIAGNOSIS — M9904 Segmental and somatic dysfunction of sacral region: Secondary | ICD-10-CM | POA: Diagnosis not present

## 2017-05-07 DIAGNOSIS — M9903 Segmental and somatic dysfunction of lumbar region: Secondary | ICD-10-CM | POA: Diagnosis not present

## 2017-05-08 DIAGNOSIS — M5430 Sciatica, unspecified side: Secondary | ICD-10-CM | POA: Diagnosis not present

## 2017-05-08 DIAGNOSIS — M9903 Segmental and somatic dysfunction of lumbar region: Secondary | ICD-10-CM | POA: Diagnosis not present

## 2017-05-08 DIAGNOSIS — M9905 Segmental and somatic dysfunction of pelvic region: Secondary | ICD-10-CM | POA: Diagnosis not present

## 2017-05-08 DIAGNOSIS — M9904 Segmental and somatic dysfunction of sacral region: Secondary | ICD-10-CM | POA: Diagnosis not present

## 2017-05-12 DIAGNOSIS — M9904 Segmental and somatic dysfunction of sacral region: Secondary | ICD-10-CM | POA: Diagnosis not present

## 2017-05-12 DIAGNOSIS — M5430 Sciatica, unspecified side: Secondary | ICD-10-CM | POA: Diagnosis not present

## 2017-05-12 DIAGNOSIS — M9905 Segmental and somatic dysfunction of pelvic region: Secondary | ICD-10-CM | POA: Diagnosis not present

## 2017-05-12 DIAGNOSIS — M9903 Segmental and somatic dysfunction of lumbar region: Secondary | ICD-10-CM | POA: Diagnosis not present

## 2017-05-28 ENCOUNTER — Ambulatory Visit: Payer: Medicare Other

## 2017-06-02 ENCOUNTER — Ambulatory Visit (INDEPENDENT_AMBULATORY_CARE_PROVIDER_SITE_OTHER): Payer: Medicare Other | Admitting: *Deleted

## 2017-06-02 DIAGNOSIS — Z0000 Encounter for general adult medical examination without abnormal findings: Secondary | ICD-10-CM

## 2017-06-02 DIAGNOSIS — Z23 Encounter for immunization: Secondary | ICD-10-CM

## 2017-06-02 NOTE — Progress Notes (Signed)
Subjective:   Marissa Gomez is a 71 y.o. female who presents for an Initial Medicare Annual Wellness Visit. 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  She enjoys reading, gardening, and going to [employer].  She lives with her husband of 81 years, and has 2 sons and 5 grandchildren.  She attends [employer]. She feels that her health is the same as last year.  She has had no hospitalizations, ER visits, or surgeries in the past year.       Review of Systems    Left side lower back pain, down back of left leg to back of knee - rated as 2 today.  Patient has chronic left sciatic pain  All other systems normal       Objective:    Today's Vitals   06/02/17 0847  BP: 108/72  Pulse: 82  Weight: 160 lb (72.6 kg)  Height: 5\' 4"  (1.626 m)  PainSc: 2   PainLoc: Back   Body mass index is 27.46 kg/m.   Current Medications (verified) Outpatient Encounter Prescriptions as of 06/02/2017  Medication Sig  . alendronate (FOSAMAX) 70 MG tablet Take 70 mg by mouth once a week. Take with a full glass of water on an empty stomach.  . Calcium Carbonate-Vit D-Min (CALCIUM 1200 PO) Take by mouth.  . cholecalciferol (VITAMIN D) 1000 units tablet Take 1,000 Units by mouth daily.  . Melatonin 1 MG TABS Take 1 tablet by mouth at bedtime.  . Multiple Vitamins-Minerals (CENTRUM SILVER ADULT 50+ PO) Take by mouth.  . Red Yeast Rice 600 MG CAPS Take by mouth.  . [DISCONTINUED] azithromycin (ZITHROMAX) 250 MG tablet Take 2 the first day and then one each day after. (Patient not taking: Reported on 06/02/2017)   No facility-administered encounter medications on file as of 06/02/2017.     Allergies (verified) Penicillins   History: No past medical history on file. Past Surgical History:  Procedure Laterality Date  . ABDOMINAL HYSTERECTOMY    . GALLBLADDER SURGERY     Family History  Problem Relation Age of Onset  .  Heart disease Father    Social History   Occupational History  . [employer]     Retired   Social History Main Topics  . Smoking status: Former Smoker    Packs/day: 0.50    Types: Cigarettes    Start date: 03/21/1989    Quit date: 03/21/2009  . Smokeless tobacco: Never Used  . Alcohol use No  . Drug use: No  . Sexual activity: Yes    Tobacco Counseling Counseling given: No   Activities of Daily Living In your present state of health, do you have any difficulty performing the following activities: 06/02/2017  Hearing? N  Vision? N  Difficulty concentrating or making decisions? N  Walking or climbing stairs? Y  Dressing or bathing? N  Doing errands, shopping? N  Some recent data might be hidden    Immunizations and Health Maintenance Immunization History  Administered Date(s) Administered  . Influenza, High Dose Seasonal PF 10/28/2015, 10/16/2016  . Influenza,inj,Quad PF,36+ Mos 10/14/2013  . Pneumococcal Conjugate-13 08/24/2015  . Pneumococcal Polysaccharide-23 06/02/2017   Health Maintenance Due  Topic Date Due  . Hepatitis C Screening  August 13, 1946  . TETANUS/TDAP  01/29/1963  . MAMMOGRAM  08/06/2015  . DEXA SCAN  04/25/2016  . PNA vac Low Risk Adult (2 of 2 - PPSV23) 08/23/2016  Mammogram done 03/10/17 at [facility] copy of results requested.   Offered Tdap, patient deferred today Dexa scan done at [facility] in [place] 09/19/16- copy of results requested.    Patient Care Team: [place] Lozito, Fransisca Kaufmann, MD as PCP - General (Family Medicine) Suella Broad, MD as Consulting Physician (Orthopedic Surgery) Vania Rea, MD as Consulting Physician (Obstetrics and Gynecology)  Indicate any recent [place] you may have received from other than [facility] providers in the past year (date may be approximate).     Assessment:   This is a routine wellness examination for Marissa Gomez.   Hearing/Vision screen No hearing deficit noted Goes to  [facility] for Eye exams- up to date  Dietary issues and exercise activities discussed: Current Exercise Habits: Home exercise routine, Type of exercise: walking, Time (Minutes): 15, Frequency (Times/Week): 2, Weekly Exercise (Minutes/Week): 30, Intensity: Mild, Exercise limited by: orthopedic condition(s);neurologic condition(s) (left sided sciatica)  Goals    . Exercise 3x per week (30 min per time)          Increase walking to 20-30 minutes per day at least 3 times per week      Depression Screen PHQ 2/9 Scores 06/02/2017 12/14/2016 11/29/2016 01/03/2016 12/25/2015 08/24/2015 03/21/2014  PHQ - 2 Score 0 0 0 0 0 0 0    Fall Risk Fall Risk  06/02/2017 12/14/2016 01/03/2016 12/25/2015 08/24/2015  Falls in the past year? Yes No No No No  Number falls in past yr: 1 - - - -  Injury with Fall? No - - - -  Follow up Education provided;Falls prevention discussed - - - -    Cognitive Function: MMSE - Mini Mental State Exam 06/02/2017 08/24/2015  Orientation to time 5 5  Orientation to Place 5 5  Registration 3 3  Attention/ Calculation 5 5  Recall 3 3  Language- name 2 objects 2 2  Language- repeat 1 1  Language- follow 3 step command 3 3  Language- read & follow direction 1 1  Write a sentence 1 1  Copy design 1 1  Total score 30 30        Screening Tests Health Maintenance  Topic Date Due  . Hepatitis C Screening  Dec 08, 1946  . TETANUS/TDAP  01/29/1963  . MAMMOGRAM  08/06/2015  . DEXA SCAN  04/25/2016  . PNA vac Low Risk Adult (2 of 2 - PPSV23) 08/23/2016  . INFLUENZA VACCINE  07/09/2017  . COLONOSCOPY  02/15/2024   Pneumovax 23- given today tdap deferred by patient today Mammogram- done 03/10/17- results requested Dexa scan- 09/19/16 results requested   Plan:     Work on increasing walking to 20-30 minutes per day at least 3 days per week Continue healthy diet of mostly lean proteins, vegetables, fruits, and whole grains   I have personally reviewed and noted the  following in the patient's chart:   . Medical and social history . Use of alcohol, tobacco or illicit drugs  . Current medications and supplements . Functional ability and status . Nutritional status . Physical activity . Advanced directives . List of other physicians . Hospitalizations, surgeries, and ER visits in previous 12 months . Vitals . Screenings to include cognitive, depression, and falls . Referrals and appointments  In addition, I have reviewed and discussed with patient certain preventive protocols, quality metrics, and best practice recommendations. A written personalized care plan for preventive services as well as general preventive health recommendations were provided to patient.  WYATT, AMY M, RN   06/02/2017    I have reviewed and agree with the above AWV documentation.   Caryl Pina, MD [facility] 06/03/2017, 10:50 AM

## 2017-06-02 NOTE — Patient Instructions (Signed)
Thank you for seeing [place] for your Annual Wellness Visit today!!  You received your Pneumovax vaccine today  Please work on increasing your walking to 20-30 minutes per day at least 3 days per week  Continue your healthy diet of mostly lean proteins, vegetables, fruits, and whole grains   Fall Prevention in the Home Falls can cause injuries and can affect people from all age groups. There are many simple things that you can do to make your home safe and to help prevent falls. What can I do on the outside of my home?  Regularly repair the edges of walkways and driveways and fix any cracks.  Remove high doorway thresholds.  Trim any shrubbery on the main path into your home.  Use bright outdoor lighting.  Clear walkways of debris and clutter, including tools and rocks.  Regularly check that handrails are securely fastened and in good repair. Both sides of any steps should have handrails.  Install guardrails along the edges of any raised decks or porches.  Have leaves, snow, and ice cleared regularly.  Use sand or salt on walkways during winter months.  In the garage, clean up any spills right away, including grease or oil spills. What can I do in the bathroom?  Use night lights.  Install grab bars by the toilet and in the tub and shower. Do not use towel bars as grab bars.  Use non-skid mats or decals on the floor of the tub or shower.  If you need to sit down while you are in the shower, use a plastic, non-slip stool.  Keep the floor dry. Immediately clean up any water that spills on the floor.  Remove soap buildup in the tub or shower on a regular basis.  Attach bath mats securely with double-sided non-slip rug tape.  Remove throw rugs and other tripping hazards from the floor. What can I do in the bedroom?  Use night lights.  Make sure that a bedside light is easy to reach.  Do not use oversized bedding that drapes onto the floor.  Have a firm chair that has  side arms to use for getting dressed.  Remove throw rugs and other tripping hazards from the floor. What can I do in the kitchen?  Clean up any spills right away.  Avoid walking on wet floors.  Place frequently used items in easy-to-reach places.  If you need to reach for something above you, use a sturdy step stool that has a grab bar.  Keep electrical cables out of the way.  Do not use floor polish or wax that makes floors slippery. If you have to use wax, make sure that it is non-skid floor wax.  Remove throw rugs and other tripping hazards from the floor. What can I do in the stairways?  Do not leave any items on the stairs.  Make sure that there are handrails on both sides of the stairs. Fix handrails that are broken or loose. Make sure that handrails are as long as the stairways.  Check any carpeting to make sure that it is firmly attached to the stairs. Fix any carpet that is loose or worn.  Avoid having throw rugs at the top or bottom of stairways, or secure the rugs with carpet tape to prevent them from moving.  Make sure that you have a light switch at the top of the stairs and the bottom of the stairs. If you do not have them, have them installed. What are some other  fall prevention tips?  Wear closed-toe shoes that fit well and support your feet. Wear shoes that have rubber soles or low heels.  When you use a stepladder, make sure that it is completely opened and that the sides are firmly locked. Have someone hold the ladder while you are using it. Do not climb a closed stepladder.  Add color or contrast paint or tape to grab bars and handrails in your home. Place contrasting color strips on the first and last steps.  Use mobility aids as needed, such as canes, walkers, scooters, and crutches.  Turn on lights if it is dark. Replace any light bulbs that burn out.  Set up furniture so that there are clear paths. Keep the furniture in the same spot.  Fix any uneven  floor surfaces.  Choose a carpet design that does not hide the edge of steps of a stairway.  Be aware of any and all pets.  Review your medicines with your healthcare provider. Some medicines can cause dizziness or changes in blood pressure, which increase your risk of falling. Talk with your health care provider about other ways that you can decrease your risk of falls. This may include working with a physical therapist or trainer to improve your strength, balance, and endurance. This information is not intended to replace advice given to you by your health care provider. Make sure you discuss any questions you have with your health care provider. Document Released: 11/15/2002 Document Revised: 04/23/2016 Document Reviewed: 12/30/2014 Elsevier Interactive Patient Education  2017 [employer] 65 Years and Older, Female Preventive care refers to lifestyle choices and visits with your health care provider that can promote health and wellness. What does preventive care include?  A yearly physical exam. This is also called an annual well check.  Dental exams once or twice a year.  Routine eye exams. Ask your health care provider how often you should have your eyes checked.  Personal lifestyle choices, including: ? Daily care of your teeth and gums. ? Regular physical activity. ? Eating a healthy diet. ? Avoiding tobacco and drug use. ? Limiting alcohol use. ? Practicing safe sex. ? Taking low-dose aspirin every day. ? Taking vitamin and mineral supplements as recommended by your health care provider. What happens during an annual well check? The services and screenings done by your health care provider during your annual well check will depend on your age, overall health, lifestyle risk factors, and family history of disease. Counseling Your health care provider may ask you questions about your:  Alcohol use.  Tobacco use.  Drug use.  Emotional  well-being.  Home and relationship well-being.  Sexual activity.  Eating habits.  History of falls.  Memory and ability to understand (cognition).  Work and work Statistician.  Reproductive health.  Screening You may have the following tests or measurements:  Height, weight, and BMI.  Blood pressure.  Lipid and cholesterol levels. These may be checked every 5 years, or more frequently if you are over 7 years old.  Skin check.  Lung cancer screening. You may have this screening every year starting at age 24 if you have a 30-pack-year history of smoking and currently smoke or have quit within the past 15 years.  Fecal occult blood test (FOBT) of the stool. You may have this test every year starting at age 43.  Flexible sigmoidoscopy or colonoscopy. You may have a sigmoidoscopy every 5 years or a colonoscopy every 10 years starting at age 16.  Hepatitis C blood test.  Hepatitis B blood test.  Sexually transmitted disease (STD) testing.  Diabetes screening. This is done by checking your blood sugar (glucose) after you have not eaten for a while (fasting). You may have this done every 1-3 years.  Bone density scan. This is done to screen for osteoporosis. You may have this done starting at age 9.  Mammogram. This may be done every 1-2 years. Talk to your health care provider about how often you should have regular mammograms.  Talk with your health care provider about your test results, treatment options, and if necessary, the need for more tests. Vaccines Your health care provider may recommend certain vaccines, such as:  Influenza vaccine. This is recommended every year.  Tetanus, diphtheria, and acellular pertussis (Tdap, Td) vaccine. You may need a Td booster every 10 years.  Varicella vaccine. You may need this if you have not been vaccinated.  Zoster vaccine. You may need this after age 47.  Measles, mumps, and rubella (MMR) vaccine. You may need at least  one dose of MMR if you were born in 1957 or later. You may also need a second dose.  Pneumococcal 13-valent conjugate (PCV13) vaccine. One dose is recommended after age 41.  Pneumococcal polysaccharide (PPSV23) vaccine. One dose is recommended after age 21.  Meningococcal vaccine. You may need this if you have certain conditions.  Hepatitis A vaccine. You may need this if you have certain conditions or if you travel or work in places where you may be exposed to hepatitis A.  Hepatitis B vaccine. You may need this if you have certain conditions or if you travel or work in places where you may be exposed to hepatitis B.  Haemophilus influenzae type b (Hib) vaccine. You may need this if you have certain conditions.  Talk to your health care provider about which screenings and vaccines you need and how often you need them. This information is not intended to replace advice given to you by your health care provider. Make sure you discuss any questions you have with your health care provider. Document Released: 12/22/2015 Document Revised: 08/14/2016 Document Reviewed: 09/26/2015 Elsevier Interactive Patient Education  2017 [employer].

## 2017-07-01 NOTE — Addendum Note (Signed)
Addended by: Ilean China on: 07/01/2017 10:18 AM   Modules accepted: Orders

## 2017-08-05 DIAGNOSIS — M5116 Intervertebral disc disorders with radiculopathy, lumbar region: Secondary | ICD-10-CM | POA: Diagnosis not present

## 2017-08-20 DIAGNOSIS — M5442 Lumbago with sciatica, left side: Secondary | ICD-10-CM | POA: Diagnosis not present

## 2017-08-20 DIAGNOSIS — M5136 Other intervertebral disc degeneration, lumbar region: Secondary | ICD-10-CM | POA: Diagnosis not present

## 2017-08-20 DIAGNOSIS — G8929 Other chronic pain: Secondary | ICD-10-CM | POA: Diagnosis not present

## 2017-09-04 DIAGNOSIS — M9903 Segmental and somatic dysfunction of lumbar region: Secondary | ICD-10-CM | POA: Diagnosis not present

## 2017-09-04 DIAGNOSIS — M9905 Segmental and somatic dysfunction of pelvic region: Secondary | ICD-10-CM | POA: Diagnosis not present

## 2017-09-04 DIAGNOSIS — M9904 Segmental and somatic dysfunction of sacral region: Secondary | ICD-10-CM | POA: Diagnosis not present

## 2017-09-04 DIAGNOSIS — M5430 Sciatica, unspecified side: Secondary | ICD-10-CM | POA: Diagnosis not present

## 2017-10-06 ENCOUNTER — Ambulatory Visit (INDEPENDENT_AMBULATORY_CARE_PROVIDER_SITE_OTHER): Payer: Medicare Other

## 2017-10-06 DIAGNOSIS — Z23 Encounter for immunization: Secondary | ICD-10-CM

## 2018-01-21 DIAGNOSIS — H04123 Dry eye syndrome of bilateral lacrimal glands: Secondary | ICD-10-CM | POA: Diagnosis not present

## 2018-01-21 DIAGNOSIS — H524 Presbyopia: Secondary | ICD-10-CM | POA: Diagnosis not present

## 2018-03-16 DIAGNOSIS — Z13220 Encounter for screening for lipoid disorders: Secondary | ICD-10-CM | POA: Diagnosis not present

## 2018-03-16 DIAGNOSIS — Z1231 Encounter for screening mammogram for malignant neoplasm of breast: Secondary | ICD-10-CM | POA: Diagnosis not present

## 2018-03-16 DIAGNOSIS — Z01419 Encounter for gynecological examination (general) (routine) without abnormal findings: Secondary | ICD-10-CM | POA: Diagnosis not present

## 2018-03-16 DIAGNOSIS — Z131 Encounter for screening for diabetes mellitus: Secondary | ICD-10-CM | POA: Diagnosis not present

## 2018-03-16 DIAGNOSIS — R5383 Other fatigue: Secondary | ICD-10-CM | POA: Diagnosis not present

## 2018-03-16 DIAGNOSIS — Z008 Encounter for other general examination: Secondary | ICD-10-CM | POA: Diagnosis not present

## 2018-06-03 ENCOUNTER — Ambulatory Visit (INDEPENDENT_AMBULATORY_CARE_PROVIDER_SITE_OTHER): Payer: Medicare Other | Admitting: *Deleted

## 2018-06-03 ENCOUNTER — Encounter: Payer: Self-pay | Admitting: *Deleted

## 2018-06-03 VITALS — BP 120/67 | HR 77 | Ht 63.75 in | Wt 164.0 lb

## 2018-06-03 DIAGNOSIS — N3281 Overactive bladder: Secondary | ICD-10-CM | POA: Insufficient documentation

## 2018-06-03 DIAGNOSIS — Z0000 Encounter for general adult medical examination without abnormal findings: Secondary | ICD-10-CM

## 2018-06-03 DIAGNOSIS — Z23 Encounter for immunization: Secondary | ICD-10-CM

## 2018-06-03 DIAGNOSIS — F419 Anxiety disorder, unspecified: Secondary | ICD-10-CM | POA: Insufficient documentation

## 2018-06-03 NOTE — Patient Instructions (Addendum)
Marissa Gomez , Thank you for taking time to come for your Medicare Wellness Visit. I appreciate your ongoing commitment to your health goals. Please review the following plan we discussed and let me know if I can assist you in the future.   These are the goals we discussed: Goals    . Exercise 3x per week (30 min per time)     Increase walking to 20-30 minutes per day at least 3 times per week       This is a list of the screening recommended for you and due dates:  Health Maintenance  Topic Date Due  .  Hepatitis C: One time screening is recommended by Center for Disease Control  (CDC) for  adults born from [birth year] through 1967.   12-12-1945  . Tetanus Vaccine  01/29/1963  . Flu Shot  07/09/2018  . DEXA scan (bone density measurement)  09/19/2018  . Mammogram  03/17/2020  . Colon Cancer Screening  02/15/2024  . Pneumonia vaccines  Completed   Follow up with Dr Warrick Parisian about trouble sleeping if you don't see any improvement  Tdap Vaccine (Tetanus, Diphtheria and Pertussis): What You Need to Know 1. Why get vaccinated? Tetanus, diphtheria and pertussis are very serious diseases. Tdap vaccine can protect [place] from these diseases. And, Tdap vaccine given to pregnant women can protect newborn babies against pertussis. TETANUS (Lockjaw) is rare in the [place] today. It causes painful muscle tightening and stiffness, usually all over the body.  It can lead to tightening of muscles in the head and neck so you can't open your mouth, swallow, or sometimes even breathe. Tetanus kills about 1 out of 10 people who are infected even after receiving the best medical care.  DIPHTHERIA is also rare in the [place] today. It can cause a thick coating to form in the back of the throat.  It can lead to breathing problems, heart failure, paralysis, and death.  PERTUSSIS (Whooping Cough) causes severe coughing spells, which can cause difficulty breathing, vomiting and disturbed sleep.  It can  also lead to weight loss, incontinence, and rib fractures. Up to 2 in 100 adolescents and 5 in 100 adults with pertussis are hospitalized or have complications, which could include pneumonia or death.  These diseases are caused by bacteria. Diphtheria and pertussis are spread from person to person through secretions from coughing or sneezing. Tetanus enters the body through cuts, scratches, or wounds. Before vaccines, as many as 200,000 cases of diphtheria, 200,000 cases of pertussis, and hundreds of cases of tetanus, were reported in the [place] each year. Since vaccination began, reports of cases for tetanus and diphtheria have dropped by about 99% and for pertussis by about 80%. 2. Tdap vaccine Tdap vaccine can protect adolescents and adults from tetanus, diphtheria, and pertussis. One dose of Tdap is routinely given at age 11 or 27. People who did not get Tdap at that age should get it as soon as possible. Tdap is especially important for healthcare professionals and anyone having close contact with a baby younger than 12 months. Pregnant women should get a dose of Tdap during every pregnancy, to protect the newborn from pertussis. Infants are most at risk for severe, life-threatening complications from pertussis. Another vaccine, called Td, protects against tetanus and diphtheria, but not pertussis. A Td booster should be given every 10 years. Tdap may be given as one of these boosters if you have never gotten Tdap before. Tdap may also be given after  a severe cut or burn to prevent tetanus infection. Your doctor or the person giving you the vaccine can give you more information. Tdap may safely be given at the same time as other vaccines. 3. Some people should not get this vaccine  A person who has ever had a life-threatening allergic reaction after a previous dose of any diphtheria, tetanus or pertussis containing vaccine, OR has a severe allergy to any part of this vaccine, should not get  Tdap vaccine. Tell the person giving the vaccine about any severe allergies.  Anyone who had coma or long repeated seizures within 7 days after a childhood dose of DTP or DTaP, or a previous dose of Tdap, should not get Tdap, unless a cause other than the vaccine was found. They can still get Td.  Talk to your doctor if you: ? have seizures or another nervous system problem, ? had severe pain or swelling after any vaccine containing diphtheria, tetanus or pertussis, ? ever had a condition called Guillain-Barr Syndrome (GBS), ? aren't feeling well on the day the shot is scheduled. 4. Risks With any medicine, including vaccines, there is a chance of side effects. These are usually mild and go away on their own. Serious reactions are also possible but are rare. Most people who get Tdap vaccine do not have any problems with it. Mild problems following Tdap: (Did not interfere with activities)  Pain where the shot was given (about 3 in 4 adolescents or 2 in 3 adults)  Redness or swelling where the shot was given (about 1 person in 5)  Mild fever of at least 100.69F (up to about 1 in 25 adolescents or 1 in 100 adults)  Headache (about 3 or 4 people in 10)  Tiredness (about 1 person in 3 or 4)  Nausea, vomiting, diarrhea, stomach ache (up to 1 in 4 adolescents or 1 in 10 adults)  Chills, sore joints (about 1 person in 10)  Body aches (about 1 person in 3 or 4)  Rash, swollen glands (uncommon)  Moderate problems following Tdap: (Interfered with activities, but did not require medical attention)  Pain where the shot was given (up to 1 in 5 or 6)  Redness or swelling where the shot was given (up to about 1 in 16 adolescents or 1 in 12 adults)  Fever over 102F (about 1 in 100 adolescents or 1 in 250 adults)  Headache (about 1 in 7 adolescents or 1 in 10 adults)  Nausea, vomiting, diarrhea, stomach ache (up to 1 or 3 people in 100)  Swelling of the entire arm where the shot was  given (up to about 1 in 500).  Severe problems following Tdap: (Unable to perform usual activities; required medical attention)  Swelling, severe pain, bleeding and redness in the arm where the shot was given (rare).  Problems that could happen after any vaccine:  People sometimes faint after a medical procedure, including vaccination. Sitting or lying down for about 15 minutes can help prevent fainting, and injuries caused by a fall. Tell your doctor if you feel dizzy, or have vision changes or ringing in the ears.  Some people get severe pain in the shoulder and have difficulty moving the arm where a shot was given. This happens very rarely.  Any medication can cause a severe allergic reaction. Such reactions from a vaccine are very rare, estimated at fewer than 1 in a million doses, and would happen within a few minutes to a few hours after the  vaccination. As with any medicine, there is a very remote chance of a vaccine causing a serious injury or death. The safety of vaccines is always being monitored. For more information, visit: http://www.aguilar.org/ 5. What if there is a serious problem? What should I look for? Look for anything that concerns you, such as signs of a severe allergic reaction, very high fever, or unusual behavior. Signs of a severe allergic reaction can include hives, swelling of the face and throat, difficulty breathing, a fast heartbeat, dizziness, and weakness. These would usually start a few minutes to a few hours after the vaccination. What should I do?  If you think it is a severe allergic reaction or other emergency that can't wait, call 9-1-1 or get the person to the nearest hospital. Otherwise, call your doctor.  Afterward, the reaction should be reported to the Vaccine Adverse Event Reporting System (VAERS). Your doctor might file this report, or you can do it yourself through the VAERS web site at www.vaers.SamedayNews.es, or by calling (847)378-0703. ? VAERS  does not give medical advice. 6. The National Vaccine Injury Compensation Program The [employer] Vaccine Injury Compensation Program (VICP) is a federal program that was created to compensate people who may have been injured by certain vaccines. Persons who believe they may have been injured by a vaccine can learn about the program and about filing a claim by calling (904) 331-0369 or visiting the [place] website at GoldCloset.com.ee. There is a time limit to file a claim for compensation. 7. How can I learn more?  Ask your doctor. He or she can give you the vaccine package insert or suggest other sources of information.  Call your local or state health department.  Contact the Centers for Disease Control and Prevention (CDC): ? Call 838-877-9202 (1-800-CDC-INFO) or ? Visit CDC's website at http://hunter.com/ CDC Tdap Vaccine VIS (02/01/14) This information is not intended to replace advice given to you by your health care provider. Make sure you discuss any questions you have with your health care provider. Document Released: 05/26/2012 Document Revised: 08/15/2016 Document Reviewed: 08/15/2016 Elsevier Interactive Patient Education  2017 [employer].

## 2018-06-03 NOTE — Progress Notes (Signed)
Subjective:   TYE JUAREZ is a 72 y.o. female who presents for a Medicare Annual Wellness Visit. Airis lives at home with her husband. She has two adult sons. One lives one street over and the other lives in [place]. She recently returned from spending a week in [place] with him and his family. She has 5 grandchildren and 3 great grandchildren.     Review of Systems    Patient reports that her overall health is better compared to last year. Sciatic pain has resolved.   Cardiac Risk Factors include: advanced age (>35men, >49 women);sedentary lifestyle  Having difficulty falling asleep and staying asleep. Takes Advil PM from time to time and Melatonin.   All other systems negative     Current Medications (verified) Outpatient Encounter Medications as of 06/03/2018  Medication Sig  . alendronate (FOSAMAX) 70 MG tablet Take 70 mg by mouth once a week. Take with a full glass of water on an empty stomach.  . Calcium Carbonate-Vit D-Min (CALCIUM 1200 PO) Take by mouth.  . cholecalciferol (VITAMIN D) 1000 units tablet Take 1,000 Units by mouth daily.  . Melatonin 1 MG TABS Take 1 tablet by mouth at bedtime.  . Multiple Vitamins-Minerals (CENTRUM SILVER ADULT 50+ PO) Take by mouth.  . Red Yeast Rice 600 MG CAPS Take by mouth.   No facility-administered encounter medications on file as of 06/03/2018.     Allergies (verified) Penicillins   History: Past Medical History:  Diagnosis Date  . Osteoporosis    Past Surgical History:  Procedure Laterality Date  . ABDOMINAL HYSTERECTOMY    . GALLBLADDER SURGERY     Family History  Problem Relation Age of Onset  . Heart disease Father   . Cancer Father        prostate   Social History   Socioeconomic History  . Marital status: Married    Spouse name: Not on file  . Number of children: 2  . Years of education: 73  . Highest education level: 12th grade  Occupational History  . 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 Financial resource strain: Not hard at all  . Food insecurity:    Worry: Never true    Inability: Never true  . Transportation needs:    Medical: No    Non-medical: No  Tobacco Use  . Smoking status: Former Smoker    Packs/day: 0.50    Years: 20.00    Pack years: 10.00    Types: Cigarettes    Start date: 03/21/1989    Last attempt to quit: 03/21/2009    Years since quitting: 9.2  . Smokeless tobacco: Never Used  Substance and Sexual Activity  . Alcohol use: No  . Drug use: No  . Sexual activity: Yes  Lifestyle  . Physical activity:    Days per week: 0 days    Minutes per session: 0 min  . Stress: Not on file  Relationships  . Social connections:    Talks on phone: More than three times a week    Gets together: More than three times a week    Attends religious service: More than 4 times per year    Active member of club or organization: Yes    Attends meetings of clubs or organizations: More than 4 times per year    Relationship status: Married  Other Topics Concern  . Not on file  Social History Narrative  . Not  on file    Tobacco Use No.  Clinical Intake:    Pain : No/denies pain    Nutritional Status: BMI 25 -29 Overweight Diabetes: No  How often do you need to have someone help you when you read instructions, pamphlets, or other written materials from your doctor or pharmacy?: 1 - Never What is the last grade level you completed in school?: 12th  Interpreter Needed?: No  Information entered by :: Chong Sicilian, RN   Activities of Daily Living In your present state of health, do you have any difficulty performing the following activities: 06/03/2018  Hearing? N  Vision? N  Comment has yearly eye exams  Difficulty concentrating or making decisions? N  Walking or climbing stairs? N  Dressing or bathing? N  Doing errands, shopping? N  Preparing Food and eating ? N  Using the Toilet? N  In the past six months, have you  accidently leaked urine? N  Do you have problems with loss of bowel control? N  Managing your Medications? N  Managing your Finances? N  Housekeeping or managing your Housekeeping? N  Some recent data might be hidden     Diet 3 meals a day. Eats mostly at home and eats out some.  Drinks mostly water.   Exercise Current Exercise Habits: The patient does not participate in regular exercise at present, Exercise limited by: None identified   Depression Screen PHQ 2/9 Scores 06/03/2018 06/02/2017 12/14/2016 11/29/2016 01/03/2016 12/25/2015 08/24/2015  PHQ - 2 Score 0 0 0 0 0 0 0     Fall Risk Fall Risk  06/03/2018 06/02/2017 12/14/2016 01/03/2016 12/25/2015  Falls in the past year? No Yes No No No  Number falls in past yr: - 1 - - -  Injury with Fall? - No - - -  Risk for fall due to : History of fall(s) - - - -  Follow up - Education provided;Falls prevention discussed - - -    Safety Is the patient's home free of loose throw rugs in walkways, pet beds, electrical cords, etc?   yes      Grab bars in the bathroom? no      Walkin shower? yes      Shower Seat? no      Handrails on the stairs?   yes      Adequate lighting?   yes  Patient Care Team: Dettinger, Fransisca Kaufmann, MD as PCP - General (Family Medicine) Suella Broad, MD as Consulting Physician (Orthopedic Surgery) Vania Rea, MD as Consulting Physician (Obstetrics and Gynecology)   No hospitalizations, ER visits, or surgeries this past year.  Objective:    Today's Vitals   06/03/18 0900  BP: 120/67  Pulse: 77  Weight: 164 lb (74.4 kg)  Height: 5' 3.75" (1.619 m)   Body mass index is 28.37 kg/m.  Advanced Directives 06/03/2018 06/02/2017 08/24/2015  Does Patient Have a Medical Advance Directive? No Yes No  Type of Advance Directive - Living will -  Would patient like information on creating a medical advance directive? No - Patient declined - Yes - Educational materials given    Hearing/Vision  normal or No deficits  noted during visit.  Cognitive Function: MMSE - Mini Mental State Exam 06/03/2018 06/02/2017 08/24/2015  Orientation to time 4 5 5   Orientation to Place 5 5 5   Registration 3 3 3   Attention/ Calculation 5 5 5   Recall 3 3 3   Language- name 2 objects 2 2 2   Language- repeat 1  1 1  Language- follow 3 step command 3 3 3   Language- read & follow direction 1 1 1   Write a sentence 1 1 1   Copy design 1 1 1   Total score 29 30 30        Normal Cognitive Function Screening: Yes    Immunizations and Health Maintenance Immunization History  Administered Date(s) Administered  . Influenza, High Dose Seasonal PF 10/28/2015, 10/16/2016, 10/06/2017  . Influenza,inj,Quad PF,6+ Mos 10/14/2013  . Pneumococcal Conjugate-13 08/24/2015  . Pneumococcal Polysaccharide-23 06/02/2017  . Tdap 06/03/2018   Health Maintenance Due  Topic Date Due  . Hepatitis C Screening  January 12, 1946  . TETANUS/TDAP  01/29/1963   Health Maintenance  Topic Date Due  . Hepatitis C Screening  [birth year]  . TETANUS/TDAP  01/29/1963  . INFLUENZA VACCINE  07/09/2018  . DEXA SCAN  09/19/2018  . MAMMOGRAM  03/17/2020  . COLONOSCOPY  02/15/2024  . PNA vac Low Risk Adult  Completed        Assessment:   This is a routine wellness examination for Lance.    Plan:    Goals    . Exercise 3x per week (30 min per time)     Increase walking to 20-30 minutes per day at least 3 times per week        Health Maintenance Recommendations: Td vaccine Shingles ($45 cost today. Decided to wait until fall))   Additional Screening Recommendations: Lung: Low Dose CT Chest recommended if Age 67-80 years, 30 pack-year currently smoking OR have quit w/in 15years. Patient does not qualify. Hepatitis C Screening recommended: yes  Today's Orders Orders Placed This Encounter  Procedures  . Tdap vaccine greater than or equal to 7yo IM  Given today. $45 cost to patient.   Keep f/u with Dettinger, Fransisca Kaufmann, MD and any other  specialty appointments you may have Continue current medications Try taking melatonin around 7:30 or 8:00 pm instead of much later.  Develop good sleep hygiene. Increasing exercise during the day can help with sleep.  D/C Advil PM if advil component is not needed for pain. Can just take generic benadryl instead.  Recommend walking in the afternoon when it is cooler.  Follow up with Dr Dettinger if sleep doesn't improve Move carefully to avoid falls. Aim for at least 150 minutes of moderate activity a week.  Read or work on puzzles daily Stay connected with friends and family  I have personally reviewed and noted the following in the patient's chart:   . Medical and social history . Use of alcohol, tobacco or illicit drugs  . Current medications and supplements . Functional ability and status . Nutritional status . Physical activity . Advanced directives . List of other physicians . Hospitalizations, surgeries, and ER visits in previous 12 months . Vitals . Screenings to include cognitive, depression, and falls . Referrals and appointments  In addition, I have reviewed and discussed with patient certain preventive protocols, quality metrics, and best practice recommendations. A written personalized care plan for preventive services as well as general preventive health recommendations were provided to patient.     Chong Sicilian, RN   06/03/2018

## 2018-08-13 ENCOUNTER — Ambulatory Visit: Payer: Medicare Other | Admitting: *Deleted

## 2018-08-13 DIAGNOSIS — Z23 Encounter for immunization: Secondary | ICD-10-CM

## 2018-08-13 NOTE — Progress Notes (Signed)
Pt given Shingrix vaccine Tolerated well 

## 2018-10-06 ENCOUNTER — Ambulatory Visit (INDEPENDENT_AMBULATORY_CARE_PROVIDER_SITE_OTHER): Payer: Medicare Other

## 2018-10-06 DIAGNOSIS — Z23 Encounter for immunization: Secondary | ICD-10-CM | POA: Diagnosis not present

## 2019-01-12 ENCOUNTER — Encounter: Payer: Self-pay | Admitting: Family Medicine

## 2019-01-12 ENCOUNTER — Ambulatory Visit (INDEPENDENT_AMBULATORY_CARE_PROVIDER_SITE_OTHER): Payer: Medicare Other | Admitting: Family Medicine

## 2019-01-12 VITALS — BP 125/73 | HR 92 | Temp 98.0°F | Ht 63.75 in | Wt 163.4 lb

## 2019-01-12 DIAGNOSIS — H60332 Swimmer's ear, left ear: Secondary | ICD-10-CM | POA: Diagnosis not present

## 2019-01-12 MED ORDER — NEOMYCIN-POLYMYXIN-HC 3.5-10000-1 OT SOLN: 3.0000 [drp] | Freq: Four times a day (QID) | OTIC | 0 refills | Status: AC

## 2019-01-12 NOTE — Progress Notes (Signed)
BP 125/73   Pulse 92   Temp 98 F (36.7 C) (Oral)   Ht 5' 3.75" (1.619 m)   Wt 163 lb 6.4 oz (74.1 kg)   BMI 28.27 kg/m    Subjective:    Patient ID: Marissa Gomez, female    DOB: 10-18-1946, 73 y.o.   MRN: [id number]  HPI: Marissa Gomez is a 73 y.o. female presenting on 01/12/2019 for Ear Pain   HPI Left ear pain Patient comes in with complaints of left ear pain.  She says she recurrently has a lot of issues with wax and recently over the past 3 days she felt like it was building up and she was trying to wash it out with Debrox drops and suck it out and she was able to get some wax out but now her pain is significantly worsened over the past 2 days.  She denies any fevers or chills or cough or congestion but just complains of that ear pain.  She was not able to get any wax out yesterday again when she tried to use the Debrox and felt like it hurt a lot more.  She denies any noted drainage.  Relevant past medical, surgical, family and social history reviewed and updated as indicated. Interim medical history since our last visit reviewed. Allergies and medications reviewed and updated.  Review of Systems  Constitutional: Negative for chills and fever.  HENT: Positive for ear pain. Negative for congestion and ear discharge.   Eyes: Negative for visual disturbance.  Respiratory: Negative for chest tightness and shortness of breath.   Cardiovascular: Negative for chest pain and leg swelling.  Genitourinary: Negative for difficulty urinating and dysuria.  Musculoskeletal: Negative for back pain and gait problem.  Skin: Negative for rash.  Neurological: Negative for light-headedness and headaches.  Psychiatric/Behavioral: Negative for agitation and behavioral problems.  All other systems reviewed and are negative.   Per HPI unless specifically indicated above   Allergies as of 01/12/2019      Reactions   Penicillins Rash      Medication List       Accurate as of January 12, 2019  5:55 PM. Always use your most recent med list.        alendronate 70 MG tablet Commonly known as:  FOSAMAX Take 70 mg by mouth once a week. Take with a full glass of water on an empty stomach.   CALCIUM 1200 PO Take by mouth.   CENTRUM SILVER ADULT 50+ PO Take by mouth.   cholecalciferol 1000 units tablet Commonly known as:  VITAMIN D Take 1,000 Units by mouth daily.   Melatonin 1 MG Tabs Take 1 tablet by mouth at bedtime.   neomycin-polymyxin-hydrocortisone OTIC solution Commonly known as:  CORTISPORIN Place 3 drops into the left ear 4 (four) times daily for 7 days.   Red Yeast Rice 600 MG Caps Take by mouth.          Objective:    BP 125/73   Pulse 92   Temp 98 F (36.7 C) (Oral)   Ht 5' 3.75" (1.619 m)   Wt 163 lb 6.4 oz (74.1 kg)   BMI 28.27 kg/m   Wt Readings from Last 3 Encounters:  01/12/19 163 lb 6.4 oz (74.1 kg)  06/03/18 164 lb (74.4 kg)  06/02/17 160 lb (72.6 kg)    Physical Exam Vitals signs and nursing note reviewed.  Constitutional:      General: She is not in acute  distress.    Appearance: She is well-developed. She is not diaphoretic.  HENT:     Right Ear: Tympanic membrane and ear canal normal.     Left Ear: Tympanic membrane normal. Drainage, swelling and tenderness present. Tympanic membrane is not injected, scarred, perforated or bulging.  Eyes:     Conjunctiva/sclera: Conjunctivae normal.  Skin:    General: Skin is warm and dry.     Findings: No rash.  Neurological:     Mental Status: She is alert and oriented to person, place, and time.     Coordination: Coordination normal.  Psychiatric:        Behavior: Behavior normal.         Assessment & Plan:   Problem List Items Addressed This Visit    None    Visit Diagnoses    Acute swimmer's ear of left side    -  Primary   Relevant Medications   neomycin-polymyxin-hydrocortisone (CORTISPORIN) OTIC solution      Follow up plan: Return if symptoms worsen or fail to  improve.  Counseling provided for all of the vaccine components No orders of the defined types were placed in this encounter.   Caryl Pina, MD [facility] 01/12/2019, 5:55 PM

## 2019-01-13 ENCOUNTER — Telehealth: Payer: Self-pay | Admitting: Family Medicine

## 2019-01-13 MED ORDER — OFLOXACIN 0.3 % OT SOLN: 5.0000 [drp] | Freq: Every day | OTIC | 0 refills | Status: DC

## 2019-01-13 NOTE — Telephone Encounter (Signed)
Patient aware.

## 2019-01-13 NOTE — Telephone Encounter (Signed)
Patient cannot afford medication prescribed for swimmer's ear, Cortisporin.  Would like something less expensive sent in.  Please advise.

## 2019-01-13 NOTE — Telephone Encounter (Signed)
Tell her that she can try to see if these eardrops will be cheaper, Cortisporin is 1 of the cheapest eardrops that is out there some of the others are over $200.  But if the ofloxacin is available and cheaper than this would be a better option for her.

## 2019-01-28 DIAGNOSIS — M8589 Other specified disorders of bone density and structure, multiple sites: Secondary | ICD-10-CM | POA: Diagnosis not present

## 2019-01-28 DIAGNOSIS — Z8262 Family history of osteoporosis: Secondary | ICD-10-CM | POA: Diagnosis not present

## 2019-02-01 ENCOUNTER — Telehealth: Payer: Self-pay | Admitting: Family Medicine

## 2019-02-01 NOTE — Telephone Encounter (Signed)
appt scheduled Offered earlier appt Pt only wanted to see Dr Warrick Parisian

## 2019-02-03 ENCOUNTER — Encounter: Payer: Self-pay | Admitting: Family Medicine

## 2019-02-03 ENCOUNTER — Ambulatory Visit (INDEPENDENT_AMBULATORY_CARE_PROVIDER_SITE_OTHER): Payer: Medicare Other

## 2019-02-03 ENCOUNTER — Ambulatory Visit: Payer: Medicare Other | Admitting: Family Medicine

## 2019-02-03 VITALS — BP 123/76 | HR 93 | Temp 97.0°F | Ht 63.75 in | Wt 165.4 lb

## 2019-02-03 DIAGNOSIS — M79672 Pain in left foot: Secondary | ICD-10-CM

## 2019-02-03 DIAGNOSIS — Z23 Encounter for immunization: Secondary | ICD-10-CM

## 2019-02-03 NOTE — Progress Notes (Signed)
BP 123/76   Pulse 93   Temp (!) 97 F (36.1 C) (Oral)   Ht 5' 3.75" (1.619 m)   Wt 75 kg   BMI 28.61 kg/m    Subjective:    Patient ID: Marissa Gomez, female    DOB: 07/17/1946, 73 y.o.   MRN: [id number]  HPI: Marissa Gomez is a 73 y.o. female presenting on 02/03/2019 for Foot Pain (left- Patient states when she stepped off ther deck x 2 weeks ago she got a shooting pain in her foot and it is still there.)   HPI Pt is a 73 y/o female complaining of a 2 week history of left lateral foot pain.  Her symptoms began when she stepped off of her deck and felt a shooting pain in her left foot and up the lateral side of her leg. She denies falling or twisting her ankle. Pt states pain is worse when walking. She now describes it as an aching pain rated at a 5/10, and says she is walking with a limp. She has tried taking Advil and icing her foot without relief. She denies swelling and erythema in her foot.  Relevant past medical, surgical, family and social history reviewed and updated as indicated. Interim medical history since our last visit reviewed. Allergies and medications reviewed and updated.  Review of Systems  Constitutional: Positive for activity change (foot pain with walking). Negative for fatigue.  Musculoskeletal: Positive for gait problem (limp) and joint swelling (mild in feet).       Positive for left foot pain, denies other leg or extremity pain  Skin: Negative for color change.  Psychiatric/Behavioral: Negative for behavioral problems and confusion.    Per HPI unless specifically indicated above   Allergies as of 02/03/2019      Reactions   Penicillins Rash      Medication List       Accurate as of February 03, 2019 10:07 AM. Always use your most recent med list.        alendronate 70 MG tablet Commonly known as:  FOSAMAX Take 70 mg by mouth once a week. Take with a full glass of water on an empty stomach.   CALCIUM 1200 PO Take by mouth.   CENTRUM  SILVER ADULT 50+ PO Take by mouth.   cholecalciferol 1000 units tablet Commonly known as:  VITAMIN D Take 1,000 Units by mouth daily.   Melatonin 1 MG Tabs Take 1 tablet by mouth at bedtime.   ofloxacin 0.3 % OTIC solution Commonly known as:  FLOXIN OTIC Place 5 drops into the left ear daily.   Red Yeast Rice 600 MG Caps Take by mouth.          Objective:    BP 123/76   Pulse 93   Temp (!) 97 F (36.1 C) (Oral)   Ht 5' 3.75" (1.619 m)   Wt 75 kg   BMI 28.61 kg/m   Wt Readings from Last 3 Encounters:  02/03/19 75 kg  01/12/19 74.1 kg  06/03/18 74.4 kg    Physical Exam Constitutional:      General: She is not in acute distress.    Appearance: Normal appearance.  Cardiovascular:     Heart sounds: Normal heart sounds.  Pulmonary:     Breath sounds: Normal breath sounds. No wheezing.  Musculoskeletal: Normal range of motion.        General: Tenderness present. No deformity.     Left foot: Normal range of motion.  No deformity.       Feet:  Feet:     Right foot:     Skin integrity: No erythema.     Left foot:     Skin integrity: No erythema.  Skin:    Findings: No bruising or erythema.  Neurological:     Mental Status: She is alert.     Left foot x-ray: No signs of acute bony abnormality, await final read from radiology    Assessment & Plan:   Problem List Items Addressed This Visit    None    Visit Diagnoses    Left foot pain    -  Primary   Relevant Orders   DG Foot Complete Left (Completed)      Follow up plan: X-ray showed no sign of acute fractures. Pain is most likely due to a sprain of the foot. Will call pt with results. Return if symptoms worsen or fail to improve.   Counseling provided for all of the vaccine components Orders Placed This Encounter  Procedures  . DG Foot Complete Left    Johnney Killian PA-S [facility]  I was personally present for all components of the history, physical exam and/or medical decision making.   I agree with the documentation performed by the PA student and agree with assessment and plan above.  PA student was Johnney Killian. Caryl Pina, MD [facility] 02/07/2019, 9:07 PM

## 2019-05-04 ENCOUNTER — Encounter: Payer: Self-pay | Admitting: Family Medicine

## 2019-05-04 DIAGNOSIS — Z1231 Encounter for screening mammogram for malignant neoplasm of breast: Secondary | ICD-10-CM | POA: Diagnosis not present

## 2019-05-04 DIAGNOSIS — Z131 Encounter for screening for diabetes mellitus: Secondary | ICD-10-CM | POA: Diagnosis not present

## 2019-05-04 DIAGNOSIS — Z13220 Encounter for screening for lipoid disorders: Secondary | ICD-10-CM | POA: Diagnosis not present

## 2019-05-04 DIAGNOSIS — Z008 Encounter for other general examination: Secondary | ICD-10-CM | POA: Diagnosis not present

## 2019-05-05 ENCOUNTER — Other Ambulatory Visit: Payer: Self-pay | Admitting: Obstetrics & Gynecology

## 2019-05-05 DIAGNOSIS — R928 Other abnormal and inconclusive findings on diagnostic imaging of breast: Secondary | ICD-10-CM

## 2019-05-10 ENCOUNTER — Other Ambulatory Visit: Payer: Self-pay

## 2019-05-10 ENCOUNTER — Ambulatory Visit
Admission: RE | Admit: 2019-05-10 | Discharge: 2019-05-10 | Disposition: A | Discharge status: Home / Self Care | Payer: Medicare Other | Source: Ambulatory Visit | Attending: Obstetrics & Gynecology | Admitting: Obstetrics & Gynecology

## 2019-05-10 ENCOUNTER — Ambulatory Visit: Payer: Medicare Other

## 2019-05-10 DIAGNOSIS — R922 Inconclusive mammogram: Secondary | ICD-10-CM | POA: Diagnosis not present

## 2019-05-10 DIAGNOSIS — R928 Other abnormal and inconclusive findings on diagnostic imaging of breast: Secondary | ICD-10-CM

## 2019-05-17 DIAGNOSIS — Z1211 Encounter for screening for malignant neoplasm of colon: Secondary | ICD-10-CM | POA: Diagnosis not present

## 2019-06-03 ENCOUNTER — Encounter: Payer: Self-pay | Admitting: Family Medicine

## 2019-06-07 LAB — COLOGUARD: Cologuard: POSITIVE — AB

## 2019-06-08 ENCOUNTER — Encounter: Payer: Self-pay | Admitting: *Deleted

## 2019-06-08 ENCOUNTER — Ambulatory Visit (INDEPENDENT_AMBULATORY_CARE_PROVIDER_SITE_OTHER): Payer: Medicare Other | Admitting: *Deleted

## 2019-06-08 DIAGNOSIS — Z0000 Encounter for general adult medical examination without abnormal findings: Secondary | ICD-10-CM | POA: Diagnosis not present

## 2019-06-08 NOTE — Patient Instructions (Signed)
Preventive Care 75 Years and Older, Female Preventive care refers to lifestyle choices and visits with your health care provider that can promote health and wellness. This includes:  A yearly physical exam. This is also called an annual well check.  Regular dental and eye exams.  Immunizations.  Screening for certain conditions.  Healthy lifestyle choices, such as diet and exercise. What can I expect for my preventive care visit? Physical exam Your health care provider will check:  Height and weight. These may be used to calculate body mass index (BMI), which is a measurement that tells if you are at a healthy weight.  Heart rate and blood pressure.  Your skin for abnormal spots. Counseling Your health care provider may ask you questions about:  Alcohol, tobacco, and drug use.  Emotional well-being.  Home and relationship well-being.  Sexual activity.  Eating habits.  History of falls.  Memory and ability to understand (cognition).  Work and work Statistician.  Pregnancy and menstrual history. What immunizations do I need?  Influenza (flu) vaccine  This is recommended every year. Tetanus, diphtheria, and pertussis (Tdap) vaccine  You may need a Td booster every 10 years. Varicella (chickenpox) vaccine  You may need this vaccine if you have not already been vaccinated. Zoster (shingles) vaccine  You may need this after age 75. Pneumococcal conjugate (PCV13) vaccine  One dose is recommended after age 75. Pneumococcal polysaccharide (PPSV23) vaccine  One dose is recommended after age 72. Measles, mumps, and rubella (MMR) vaccine  You may need at least one dose of MMR if you were born in 1957 or later. You may also need a second dose. Meningococcal conjugate (MenACWY) vaccine  You may need this if you have certain conditions. Hepatitis A vaccine  You may need this if you have certain conditions or if you travel or work in places where you may be exposed  to hepatitis A. Hepatitis B vaccine  You may need this if you have certain conditions or if you travel or work in places where you may be exposed to hepatitis B. Haemophilus influenzae type b (Hib) vaccine  You may need this if you have certain conditions. You may receive vaccines as individual doses or as more than one vaccine together in one shot (combination vaccines). Talk with your health care provider about the risks and benefits of combination vaccines. What tests do I need? Blood tests  Lipid and cholesterol levels. These may be checked every 5 years, or more frequently depending on your overall health.  Hepatitis C test.  Hepatitis B test. Screening  Lung cancer screening. You may have this screening every year starting at age 75 if you have a 30-pack-year history of smoking and currently smoke or have quit within the past 15 years.  Colorectal cancer screening. All adults should have this screening starting at age 75 and continuing until age 15. Your health care provider may recommend screening at age 23 if you are at increased risk. You will have tests every 1-10 years, depending on your results and the type of screening test.  Diabetes screening. This is done by checking your blood sugar (glucose) after you have not eaten for a while (fasting). You may have this done every 1-3 years.  Mammogram. This may be done every 1-2 years. Talk with your health care provider about how often you should have regular mammograms.  BRCA-related cancer screening. This may be done if you have a family history of breast, ovarian, tubal, or peritoneal cancers.  Other tests  Sexually transmitted disease (STD) testing.  Bone density scan. This is done to screen for osteoporosis. You may have this done starting at age 75. Follow these instructions at home: Eating and drinking  Eat a diet that includes fresh fruits and vegetables, whole grains, lean protein, and low-fat dairy products. Limit  your intake of foods with high amounts of sugar, saturated fats, and salt.  Take vitamin and mineral supplements as recommended by your health care provider.  Do not drink alcohol if your health care provider tells you not to drink.  If you drink alcohol: ? Limit how much you have to 0-1 drink a day. ? Be aware of how much alcohol is in your drink. In the [place] one drink equals one 12 oz bottle of beer (355 mL), one 5 oz glass of wine (148 mL), or one 1 oz glass of hard liquor (44 mL). Lifestyle  Take daily care of your teeth and gums.  Stay active. Exercise for at least 30 minutes on 5 or more days each week.  Do not use any products that contain nicotine or tobacco, such as cigarettes, e-cigarettes, and chewing tobacco. If you need help quitting, ask your health care provider.  If you are sexually active, practice safe sex. Use a condom or other form of protection in order to prevent STIs (sexually transmitted infections).  Talk with your health care provider about taking a low-dose aspirin or statin. What's next?  Go to your health care provider once a year for a well check visit.  Ask your health care provider how often you should have your eyes and teeth checked.  Stay up to date on all vaccines. This information is not intended to replace advice given to you by your health care provider. Make sure you discuss any questions you have with your health care provider. Document Released: 12/22/2015 Document Revised: 11/19/2018 Document Reviewed: 11/19/2018 Elsevier Patient Education  2020 [employer].

## 2019-06-08 NOTE — Progress Notes (Addendum)
MEDICARE ANNUAL WELLNESS VISIT  06/08/2019  Telephone Visit Disclaimer This Medicare AWV was conducted by telephone due to national recommendations for restrictions regarding the COVID-19 Pandemic (e.g. social distancing).  I verified, using two identifiers, that I am speaking with Marissa Gomez or their authorized healthcare agent. I discussed the limitations, risks, security, and privacy concerns of performing an evaluation and management service by telephone and the potential availability of an in-person appointment in the future. The patient expressed understanding and agreed to proceed.   Subjective:  Marissa Gomez is a 73 y.o. female patient of Dettinger, Fransisca Kaufmann, MD who had a Medicare Annual Wellness Visit today via telephone. Shyla is Retired and lives with their spouse. she has 2 children. she reports that she is socially active and does interact with friends/family regularly. she is not physically active and enjoys gardening.  Patient Care Team: Dettinger, Fransisca Kaufmann, MD as PCP - General (Family Medicine) Suella Broad, MD as Consulting Physician (Orthopedic Surgery) Vania Rea, MD as Consulting Physician (Obstetrics and Gynecology)  Advanced Directives 06/08/2019 06/03/2018 06/02/2017 08/24/2015  Does Patient Have a Medical Advance Directive? No No Yes No  Type of Advance Directive - - Living will -  Would patient like information on creating a medical advance directive? No - Patient declined No - Patient declined - Yes - Educational materials given    Hospital Utilization Over the Past 12 Months: # of hospitalizations or ER visits: 0 # of surgeries: 0  Review of Systems    Patient reports that her overall health is unchanged compared to last year.  Patient Reported Readings (BP, Pulse, CBG, Weight, etc) none  Review of Systems: No complaints  All other systems negative.  Pain Assessment Pain : No/denies pain     Current Medications & Allergies (verified)  Allergies as of 06/08/2019      Reactions   Penicillins Rash      Medication List       Accurate as of June 08, 2019  8:41 AM. If you have any questions, ask your nurse or doctor.        STOP taking these medications   ofloxacin 0.3 % OTIC solution Commonly known as: Floxin Otic Stopped by: WYATT, AMY M, RN     TAKE these medications   alendronate 70 MG tablet Commonly known as: FOSAMAX Take 70 mg by mouth once a week. Take with a full glass of water on an empty stomach.   CALCIUM 1200 PO Take by mouth.   CENTRUM SILVER ADULT 50+ PO Take by mouth.   cholecalciferol 1000 units tablet Commonly known as: VITAMIN D Take 1,000 Units by mouth daily.   Melatonin 1 MG Tabs Take 1 tablet by mouth at bedtime.   OMEGA-3 FISH OIL PO omega-3s 720 mg-dha 300 mg-epa 360 mg-fish oil 1,200 mg capsule  Take by oral route.   Red Yeast Rice 600 MG Caps Take by mouth.       History (reviewed): Past Medical History:  Diagnosis Date  . Osteoporosis    Past Surgical History:  Procedure Laterality Date  . ABDOMINAL HYSTERECTOMY    . GALLBLADDER SURGERY     Family History  Problem Relation Age of Onset  . Heart disease Father   . Cancer Father        prostate   Social History   Socioeconomic History  . Marital status: Married    Spouse name: Donnie  . Number of children: 2  . Years  of education: 90  . Highest education level: 12th grade  Occupational History  . 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 Financial resource strain: Not hard at all  . Food insecurity    Worry: Never true    Inability: Never true  . Transportation needs    Medical: No    Non-medical: No  Tobacco Use  . Smoking status: Former Smoker    Packs/day: 0.50    Years: 20.00    Pack years: 10.00    Types: Cigarettes    Start date: 03/21/1989    Quit date: 03/21/2009    Years since quitting: 10.2  . Smokeless tobacco: Never Used  Substance and Sexual  Activity  . Alcohol use: No  . Drug use: No  . Sexual activity: Yes    Birth control/protection: Surgical  Lifestyle  . Physical activity    Days per week: 0 days    Minutes per session: 0 min  . Stress: Not at all  Relationships  . Social connections    Talks on phone: More than three times a week    Gets together: More than three times a week    Attends religious service: More than 4 times per year    Active member of club or organization: Yes    Attends meetings of clubs or organizations: More than 4 times per year    Relationship status: Married  Other Topics Concern  . Not on file  Social History Narrative  . Not on file    Activities of Daily Living In your present state of health, do you have any difficulty performing the following activities: 06/08/2019  Hearing? N  Vision? N  Difficulty concentrating or making decisions? N  Walking or climbing stairs? N  Dressing or bathing? N  Doing errands, shopping? N  Preparing Food and eating ? N  Using the Toilet? N  In the past six months, have you accidently leaked urine? Y  Comment will wear a pad when she goes out or when she will be somewhere and doesn't know where the restrooms are  Do you have problems with loss of bowel control? N  Managing your Medications? N  Managing your Finances? N  Housekeeping or managing your Housekeeping? N  Some recent data might be hidden    Patient Literacy How often do you need to have someone help you when you read instructions, pamphlets, or other written materials from your doctor or pharmacy?: 1 - Never What is the last grade level you completed in school?: 12th grade  Exercise Current Exercise Habits: The patient does not participate in regular exercise at present, Exercise limited by: None identified  Diet Patient reports consuming 3 meals a day and 1 snack(s) a day Patient reports that her primary diet is: Regular Patient reports that she does have regular access to food.    Depression Screen PHQ 2/9 Scores 06/08/2019 02/03/2019 01/12/2019 06/03/2018 06/02/2017 12/14/2016 11/29/2016  PHQ - 2 Score 0 0 0 0 0 0 0     Fall Risk Fall Risk  06/08/2019 01/12/2019 06/03/2018 06/02/2017 12/14/2016  Falls in the past year? 0 0 No Yes No  Number falls in past yr: - - - 1 -  Injury with Fall? - - - No -  Risk for fall due to : - - History of fall(s) - -  Follow up - - - Education provided;Falls prevention discussed -     Objective:  Marissa Gomez seemed alert and oriented and she participated appropriately during our telephone visit.  Blood Pressure Weight BMI  BP Readings from Last 3 Encounters:  02/03/19 123/76  01/12/19 125/73  06/03/18 120/67   Wt Readings from Last 3 Encounters:  02/03/19 165 lb 6.4 oz (75 kg)  01/12/19 163 lb 6.4 oz (74.1 kg)  06/03/18 164 lb (74.4 kg)   BMI Readings from Last 1 Encounters:  02/03/19 28.61 kg/m    *Unable to obtain current vital signs, weight, and BMI due to telephone visit type  Hearing/Vision  . Emmy did not seem to have difficulty with hearing/understanding during the telephone conversation . Reports that she has not had a formal eye exam by an eye care professional within the past year . Reports that she has not had a formal hearing evaluation within the past year *Unable to fully assess hearing and vision during telephone visit type  Cognitive Function: 6CIT Screen 06/08/2019  What Year? 0 points  What month? 0 points  What time? 0 points  Count back from 20 0 points  Months in reverse 0 points  Repeat phrase 2 points  Total Score 2   (Normal:0-7, Significant for Dysfunction: >8)  Normal Cognitive Function Screening: Yes   Immunization & Health Maintenance Record Immunization History  Administered Date(s) Administered  . Influenza, High Dose Seasonal PF 10/28/2015, 10/16/2016, 10/06/2017, 10/06/2018  . Influenza,inj,Quad PF,6+ Mos 10/14/2013  . Influenza-Unspecified 10/09/2014, 10/09/2016  . Pneumococcal  Conjugate-13 08/24/2015  . Pneumococcal Polysaccharide-23 06/02/2017  . Tdap 06/03/2018  . Zoster 04/14/2012  . Zoster Recombinat (Shingrix) 08/13/2018, 02/03/2019    Health Maintenance  Topic Date Due  . Hepatitis C Screening  1946/09/27  . DEXA SCAN  09/19/2018  . MAMMOGRAM  03/18/2019  . INFLUENZA VACCINE  07/10/2019  . COLONOSCOPY  02/15/2024  . TETANUS/TDAP  06/03/2028  . PNA vac Low Risk Adult  Completed       Assessment  This is a routine wellness examination for Marissa Gomez.  Health Maintenance: Due or Overdue Health Maintenance Due  Topic Date Due  . Hepatitis C Screening  11-24-1946  . DEXA SCAN  09/19/2018  . MAMMOGRAM  03/18/2019    Marissa Gomez does not need a referral for Community Assistance: Care Management:   no Social Work:    no Prescription Assistance:  no Nutrition/Diabetes Education:  no   Plan:  Personalized Goals Goals Addressed            This Visit's Progress   . DIET - INCREASE WATER INTAKE       You should drink at least 64 oz of water daily      Personalized Health Maintenance & Screening Recommendations  Screening mammography Bone densitometry screening  Lung Cancer Screening Recommended: no (Low Dose CT Chest recommended if Age 89-80 years, 30 pack-year currently smoking OR have quit w/in past 15 years) Hepatitis C Screening recommended: yes HIV Screening recommended: no  Advanced Directives: Written information was not prepared per patient's request.  Referrals & Orders No orders of the defined types were placed in this encounter.   Follow-up Plan . Follow-up with Dettinger, Fransisca Kaufmann, MD as planned . Please get [place] a copy of your DEXA and Mammogram reports from this year for our records.    I have personally reviewed and noted the following in the patient's chart:   . Medical and social history . Use of alcohol, tobacco or illicit drugs  . Current medications and supplements .  Functional ability and status  . Nutritional status . Physical activity . Advanced directives . List of other physicians . Hospitalizations, surgeries, and ER visits in previous 12 months . Vitals . Screenings to include cognitive, depression, and falls . Referrals and appointments  In addition, I have reviewed and discussed with Marissa Gomez certain preventive protocols, quality metrics, and best practice recommendations. A written personalized care plan for preventive services as well as general preventive health recommendations is available and can be mailed to the patient at her request.      Marylin Crosby, LPN  [id number]  I have reviewed and agree with the above AWV documentation.   Evelina Dun, FNP

## 2019-06-09 DIAGNOSIS — K5730 Diverticulosis of large intestine without perforation or abscess without bleeding: Secondary | ICD-10-CM | POA: Diagnosis not present

## 2019-06-09 DIAGNOSIS — R195 Other fecal abnormalities: Secondary | ICD-10-CM | POA: Diagnosis not present

## 2019-06-09 DIAGNOSIS — D120 Benign neoplasm of cecum: Secondary | ICD-10-CM | POA: Diagnosis not present

## 2019-06-15 DIAGNOSIS — D120 Benign neoplasm of cecum: Secondary | ICD-10-CM | POA: Diagnosis not present

## 2019-09-01 ENCOUNTER — Encounter: Payer: Self-pay | Admitting: Family

## 2019-09-01 ENCOUNTER — Other Ambulatory Visit: Payer: Self-pay

## 2019-09-01 ENCOUNTER — Ambulatory Visit (INDEPENDENT_AMBULATORY_CARE_PROVIDER_SITE_OTHER): Payer: Medicare Other | Admitting: Family

## 2019-09-01 VITALS — BP 115/64 | HR 90 | Temp 97.3°F | Resp 16 | Ht 63.75 in | Wt 161.0 lb

## 2019-09-01 DIAGNOSIS — R3989 Other symptoms and signs involving the genitourinary system: Secondary | ICD-10-CM | POA: Diagnosis not present

## 2019-09-01 DIAGNOSIS — N3001 Acute cystitis with hematuria: Secondary | ICD-10-CM

## 2019-09-01 LAB — URINALYSIS, COMPLETE
Bilirubin, UA: NEGATIVE
Glucose, UA: NEGATIVE
Ketones, UA: NEGATIVE
Nitrite, UA: NEGATIVE
Protein,UA: NEGATIVE
Specific Gravity, UA: 1.01 (ref 1.005–1.030)
Urobilinogen, Ur: 0.2 mg/dL (ref 0.2–1.0)
pH, UA: 7 (ref 5.0–7.5)

## 2019-09-01 LAB — MICROSCOPIC EXAMINATION: WBC, UA: >30 /hpf — AB (ref 0–5)

## 2019-09-01 MED ORDER — CEPHALEXIN 500 MG PO CAPS: 500.0000 mg | ORAL_CAPSULE | Freq: Two times a day (BID) | ORAL | 0 refills | Status: DC

## 2019-09-01 NOTE — Patient Instructions (Signed)

## 2019-09-01 NOTE — Progress Notes (Signed)
   Subjective:    Patient ID: Marissa Gomez, female    DOB: 06/12/1946, 73 y.o.   MRN: [id number]  Chief Complaint  Patient presents with  . urine problems    Dysuria  This is a new problem. The current episode started in the past 7 days. The problem occurs every urination. The problem has been waxing and waning. The quality of the pain is described as burning. The pain is at a severity of 2/10. The pain is mild. Associated symptoms include frequency, hematuria, hesitancy and urgency. Pertinent negatives include no nausea or vomiting. She has tried increased fluids for the symptoms. The treatment provided mild relief.      Review of Systems  Gastrointestinal: Negative for nausea and vomiting.  Genitourinary: Positive for dysuria, frequency, hematuria, hesitancy and urgency.  All other systems reviewed and are negative.      Objective:   Physical Exam Vitals signs reviewed.  Constitutional:      General: She is not in acute distress.    Appearance: She is well-developed.  HENT:     Head: Normocephalic and atraumatic.     Right Ear: Tympanic membrane normal.     Left Ear: Tympanic membrane normal.  Eyes:     Pupils: Pupils are equal, round, and reactive to light.  Neck:     Musculoskeletal: Normal range of motion and neck supple.     Thyroid: No thyromegaly.  Cardiovascular:     Rate and Rhythm: Normal rate and regular rhythm.     Heart sounds: Normal heart sounds. No murmur.  Pulmonary:     Effort: Pulmonary effort is normal. No respiratory distress.     Breath sounds: Normal breath sounds. No wheezing.  Abdominal:     General: Bowel sounds are normal. There is no distension.     Palpations: Abdomen is soft.     Tenderness: There is no abdominal tenderness.  Musculoskeletal: Normal range of motion.        General: No tenderness.  Skin:    General: Skin is warm and dry.  Neurological:     Mental Status: She is alert and oriented to person, place, and time.   Cranial Nerves: No cranial nerve deficit.     Deep Tendon Reflexes: Reflexes are normal and symmetric.  Psychiatric:        Behavior: Behavior normal.        Thought Content: Thought content normal.        Judgment: Judgment normal.     BP 115/64   Pulse 90   Temp (!) 97.3 F (36.3 C) (Temporal)   Resp 16   Ht 5' 3.75" (1.619 m)   Wt 161 lb (73 kg)   SpO2 96%   BMI 27.85 kg/m      Assessment & Plan:  AVANNI BURAU comes in today with chief complaint of urine problems   Diagnosis and orders addressed:  1. Urine troubles - Urinalysis, Complete - Urine Culture  2. Acute cystitis with hematuria Force fluids AZO over the counter X2 days RTO prn Culture pending - cephALEXin (KEFLEX) 500 MG capsule; Take 1 capsule (500 mg total) by mouth 2 (two) times daily.  Dispense: 14 capsule; Refill: 0 - Urine Culture     Evelina Dun, FNP

## 2019-09-03 LAB — URINE CULTURE

## 2019-10-06 ENCOUNTER — Other Ambulatory Visit: Payer: Self-pay

## 2019-10-07 ENCOUNTER — Ambulatory Visit (INDEPENDENT_AMBULATORY_CARE_PROVIDER_SITE_OTHER): Payer: Medicare Other

## 2019-10-07 DIAGNOSIS — Z23 Encounter for immunization: Secondary | ICD-10-CM

## 2020-06-05 DIAGNOSIS — Z0000 Encounter for general adult medical examination without abnormal findings: Secondary | ICD-10-CM | POA: Diagnosis not present

## 2020-06-05 DIAGNOSIS — Z1231 Encounter for screening mammogram for malignant neoplasm of breast: Secondary | ICD-10-CM | POA: Diagnosis not present

## 2020-06-05 IMAGING — DX DG FOOT COMPLETE 3+V*L*
3 series · 3 of 3 positions shown · non-contrast
Comparison: None.

CLINICAL DATA: Injured foot several weeks ago.  Persistent pain.

EXAM:
LEFT FOOT - COMPLETE 3+ VIEW

[foot ap]
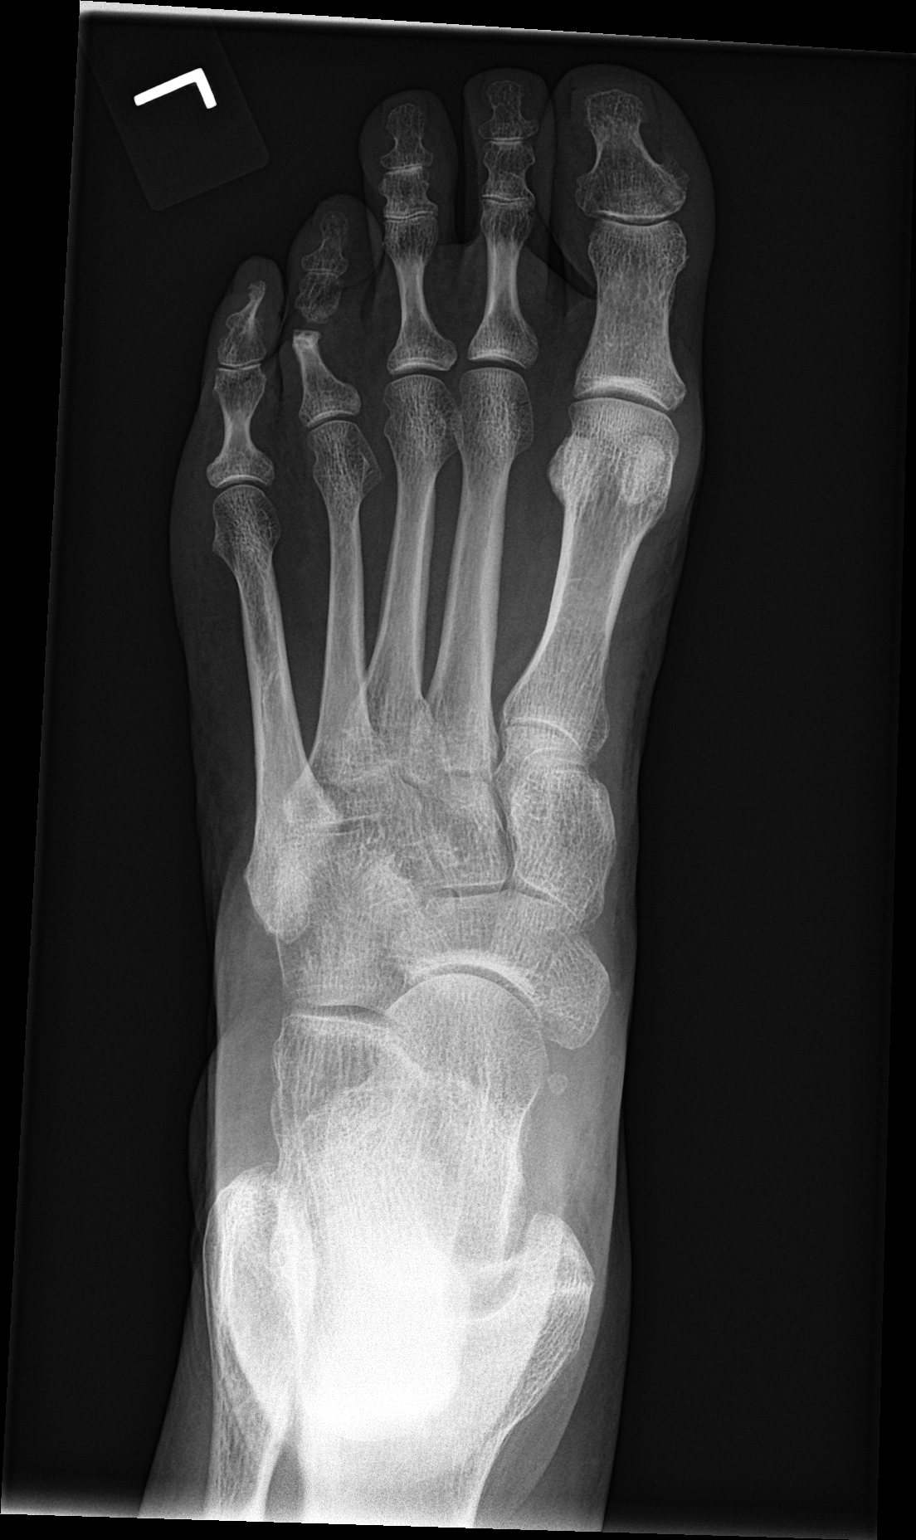

[foot obl]
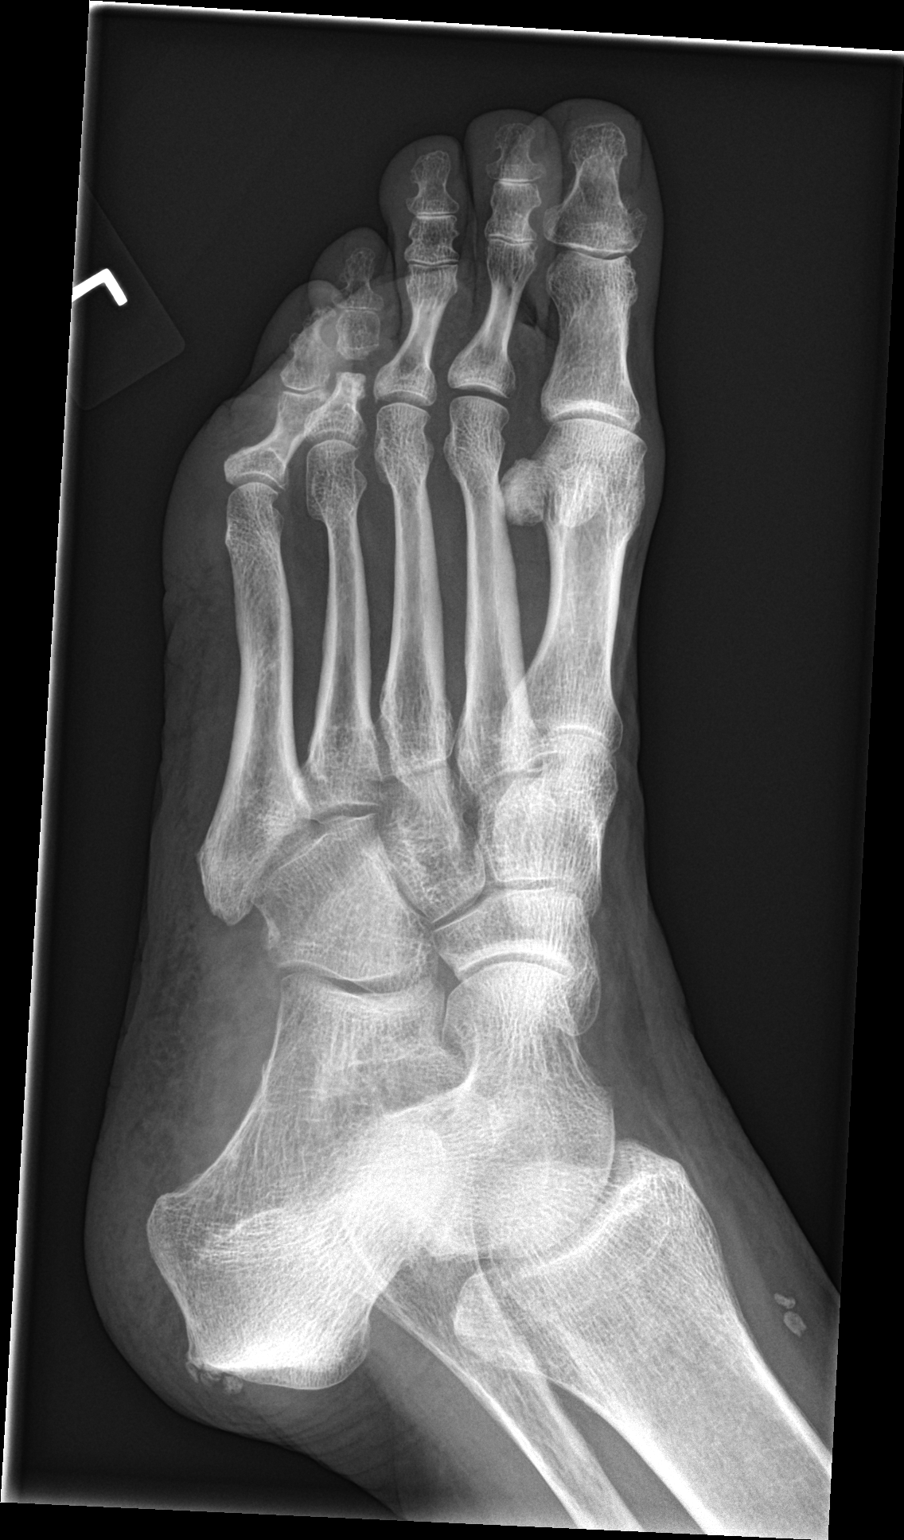

[foot lat]
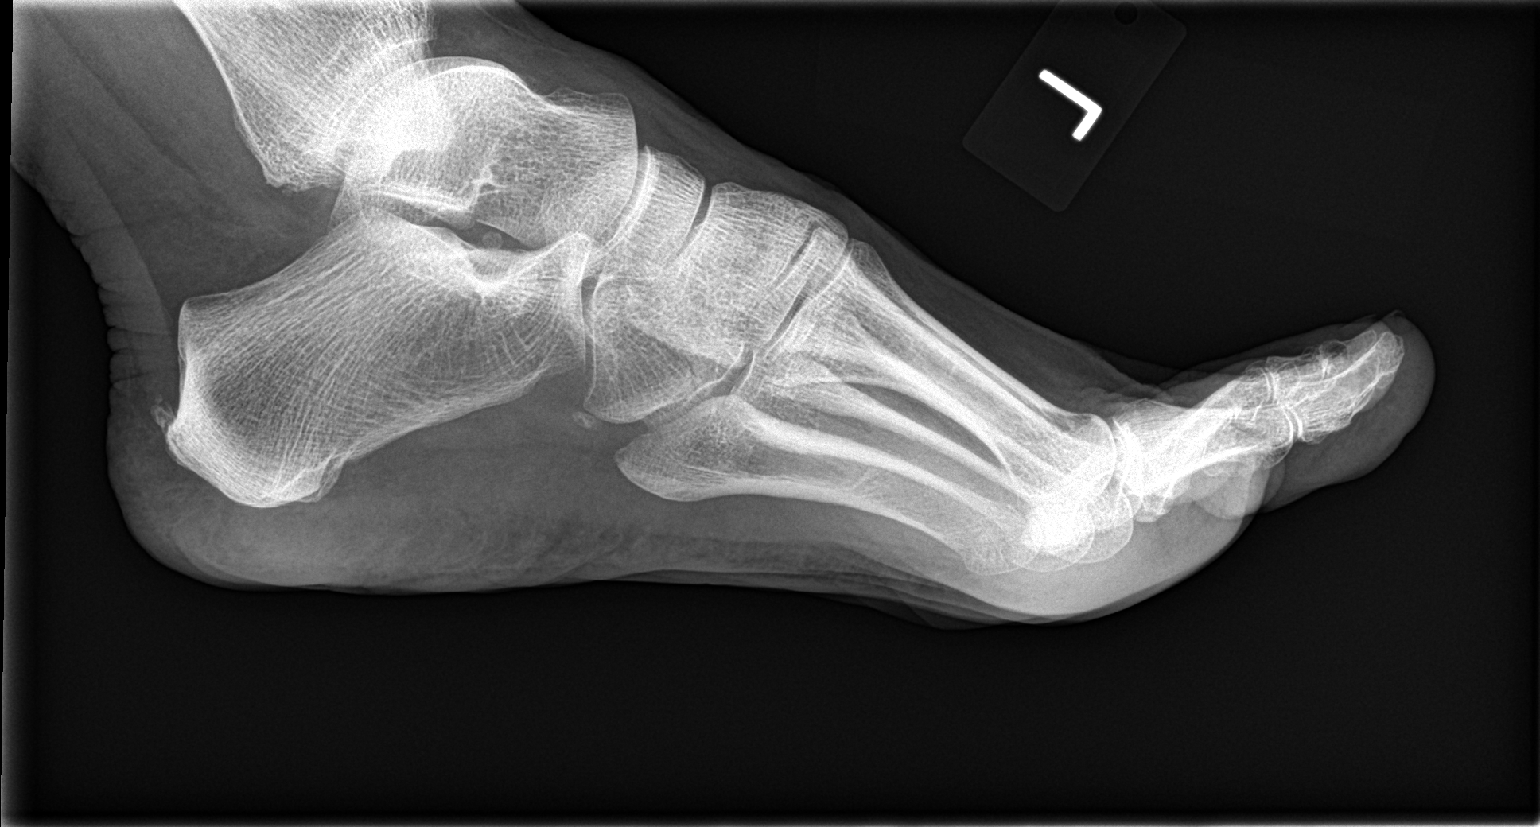

[3 of 3 positions shown; findings below may reference images not displayed]

FINDINGS: The joint spaces are maintained. No significant degenerative
changes. No acute fractures identified. Suspect prior osteotomy
involving the distal aspect of the proximal phalanx of the fourth
toe. Alternatively this could be something congenital or related to
prior trauma.

Moderate Anri Rankoko.
IMPRESSION: No acute bony findings.

Probable remote surgical changes with an osteotomy involving the
fourth proximal phalanx.

## 2020-06-08 ENCOUNTER — Ambulatory Visit (INDEPENDENT_AMBULATORY_CARE_PROVIDER_SITE_OTHER): Payer: Medicare Other | Admitting: *Deleted

## 2020-06-08 DIAGNOSIS — Z0000 Encounter for general adult medical examination without abnormal findings: Secondary | ICD-10-CM

## 2020-06-08 NOTE — Progress Notes (Signed)
MEDICARE ANNUAL WELLNESS VISIT  06/08/2020  Telephone Visit Disclaimer This Medicare AWV was conducted by telephone due to national recommendations for restrictions regarding the COVID-19 Pandemic (e.g. social distancing).  I verified, using two identifiers, that I am speaking with Marissa Gomez or their authorized healthcare agent. I discussed the limitations, risks, security, and privacy concerns of performing an evaluation and management service by telephone and the potential availability of an in-person appointment in the future. The patient expressed understanding and agreed to proceed.   Subjective:  Marissa Gomez is a 74 y.o. female patient of Dettinger, Fransisca Kaufmann, MD who had a Medicare Annual Wellness Visit today via telephone. Marissa Gomez is retired and lives with her husband Donnie. She has two sons. She reports that she is socially active and does interact with friends/family regularly. She is not physically active and enjoys reading.  Patient Care Team: Dettinger, Fransisca Kaufmann, MD as PCP - General (Family Medicine) Suella Broad, MD as Consulting Physician (Orthopedic Surgery) Vania Rea, MD as Consulting Physician (Obstetrics and Gynecology)  Advanced Directives 06/08/2020 06/08/2019 06/03/2018 06/02/2017 08/24/2015  Does Patient Have a Medical Advance Directive? No No No Yes No  Type of Advance Directive - - - Living will -  Would patient like information on creating a medical advance directive? No - Patient declined No - Patient declined No - Patient declined - Yes - Educational materials given    Hospital Utilization Over the Past 12 Months: # of hospitalizations or ER visits: 0 # of surgeries: 0  Review of Systems    Patient reports that her overall health is unchanged compared to last year.  History obtained from chart review and the patient and patient chart.   Patient Reported Readings (BP, Pulse, CBG, Weight, etc) none  Pain Assessment Pain : No/denies pain      Current Medications & Allergies (verified) Allergies as of 06/08/2020      Reactions   Penicillins Rash      Medication List       Accurate as of June 08, 2020  8:51 AM. If you have any questions, ask your nurse or doctor.        STOP taking these medications   cephALEXin 500 MG capsule Commonly known as: KEFLEX     TAKE these medications   alendronate 70 MG tablet Commonly known as: FOSAMAX Take 70 mg by mouth once a week. Take with a full glass of water on an empty stomach.   CALCIUM 1200 PO Take by mouth.   CENTRUM SILVER ADULT 50+ PO Take by mouth.   cholecalciferol 1000 units tablet Commonly known as: VITAMIN D Take 1,000 Units by mouth daily.   melatonin 1 MG Tabs tablet Take 1 tablet by mouth at bedtime.   OMEGA-3 FISH OIL PO omega-3s 720 mg-dha 300 mg-epa 360 mg-fish oil 1,200 mg capsule  Take by oral route.   Red Yeast Rice 600 MG Caps Take by mouth.       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 2 sons. Enjoys reading.   Social Determinants of Health   Financial Resource Strain:    Difficulty of Paying Living Expenses:   Food Insecurity:    Worried About Charity fundraiser in the Last Year:    Arboriculturist in the Last Year:   Transportation Needs:    Film/video editor (Medical):    Lack of Transportation (Non-Medical):   Physical Activity:    Days of Exercise per Week:    Minutes of Exercise per Session:   Stress:    Feeling of Stress :   Social Connections:    Frequency of Communication with Friends and Family:    Frequency of Social Gatherings with Friends and Family:    Attends Religious Services:    Active Member of Clubs or Organizations:    Attends Archivist Meetings:    Marital Status:     Activities of Daily Living In your present state of health, do you have any difficulty performing the following activities: 06/08/2020  Hearing? N  Vision? N  Difficulty concentrating or making decisions? N  Walking or climbing stairs? N  Dressing or bathing? N  Doing errands, shopping? N  Preparing Food and eating ? N  Using the Toilet? N  In the past six months, have you accidently leaked urine? Y  Comment wears pads sometimes when going out  Do you have problems with loss of bowel control? N  Managing your Medications? N  Managing your Finances? N  Housekeeping or managing your Housekeeping? N  Some recent data might be hidden    Patient Education/ Literacy How often do you need to have someone help you when you read instructions, pamphlets, or other written materials from your doctor or pharmacy?: 1 - Never What is the last grade level you completed in school?: 12  Exercise Current Exercise Habits: The patient does not participate in regular exercise at present, Exercise limited by: None identified  Diet Patient reports consuming 3 meals a day and 2 snack(s)  a day Patient reports that her primary diet is: Regular Patient reports that she does have regular access to food.   Depression Screen PHQ 2/9 Scores 06/08/2020 09/01/2019 06/08/2019 02/03/2019 01/12/2019 06/03/2018 06/02/2017  PHQ - 2 Score 0 0 0 0 0 0 0     Fall Risk Fall Risk  06/08/2020 09/01/2019 06/08/2019 01/12/2019 06/03/2018  Falls in the past year? 0 0 0 0 No  Number falls in past yr: - - - - -  Injury with Fall? - - - - -  Risk for fall due to : - - - - History of fall(s)  Follow up - - - - -  Objective:  Marissa Gomez seemed alert and oriented and she participated appropriately during our telephone visit.  Blood Pressure Weight BMI  BP Readings from Last 3 Encounters:  09/01/19 115/64  02/03/19 123/76  01/12/19 125/73   Wt Readings from Last 3 Encounters:  09/01/19 161 lb (73 kg)  02/03/19 165 lb 6.4 oz (75 kg)  01/12/19 163 lb 6.4 oz (74.1 kg)   BMI Readings from Last 1 Encounters:  09/01/19 27.85 kg/m    *Unable to obtain current vital signs, weight, and BMI due to telephone visit type  Hearing/Vision   Chidera did not seem to have difficulty with hearing/understanding during the telephone conversation  Reports that she has not had a formal eye exam by an eye care professional within the past year  Reports that she has not had a formal hearing evaluation within the past year *Unable to fully assess hearing and vision during telephone visit type  Cognitive Function: 6CIT Screen 06/08/2020 06/08/2019  What Year? 0 points 0 points  What month? 0 points 0 points  What time? 0 points 0 points  Count back from 20 0 points 0 points  Months in reverse 0 points 0 points  Repeat phrase 0 points 2 points  Total Score 0 2   (Normal:0-7, Significant for Dysfunction: >8)  Normal Cognitive Function Screening: Yes   Immunization & Health Maintenance Record Immunization History  Administered Date(s) Administered   Fluad Quad(high Dose 65+) 10/07/2019   Influenza, High  Dose Seasonal PF 10/21/2014, 10/28/2015, 10/16/2016, 10/06/2017, 10/06/2018   Influenza,inj,Quad PF,6+ Mos 10/14/2013   Influenza-Unspecified 10/09/2014, 10/09/2016   Pneumococcal Conjugate-13 08/24/2015   Pneumococcal Polysaccharide-23 06/02/2017   Tdap 06/03/2018   Zoster 04/14/2012   Zoster Recombinat (Shingrix) 08/13/2018, 02/03/2019    Health Maintenance  Topic Date Due   Hepatitis C Screening  Never done   INFLUENZA VACCINE  07/09/2020   DEXA SCAN  01/11/2021   MAMMOGRAM  06/05/2021   TETANUS/TDAP  06/03/2028   COVID-19 Vaccine  Completed   PNA vac Low Risk Adult  Completed       Assessment  This is a routine wellness examination for [employer].  Health Maintenance: Due or Overdue Health Maintenance Due  Topic Date Due   Hepatitis C Screening  Never done    Marissa Gomez does not need a referral for Community Assistance: Care Management:   no Social Work:    no Prescription Assistance:  no Nutrition/Diabetes Education:  no   Plan:  Personalized Goals Goals Addressed            This Visit's Progress    Patient Stated       06/08/2020 AWV Goal: Exercise for General Health   Patient will verbalize understanding of the benefits of increased physical activity:  Exercising regularly is important. It will improve your overall fitness, flexibility, and endurance.  Regular exercise also will improve your overall health. It can help you control your weight, reduce stress, and improve your bone density.  Over the next year, patient will increase physical activity as tolerated with a goal of at least 150 minutes of moderate physical activity per week.   You can tell that you are exercising at a moderate intensity if your heart starts beating faster and you start breathing faster but can still hold a conversation.  Moderate-intensity exercise ideas include:  Walking 1 mile (1.6 km) in about 15  minutes  Biking  Hiking  Golfing  Dancing  Water aerobics  Patient  will verbalize understanding of everyday activities that increase physical activity by providing examples like the following: ? Yard work, such as: ? Pushing a Conservation officer, nature ? Raking and bagging leaves ? Washing your car ? Pushing a stroller ? Shoveling snow ? Gardening ? Washing windows or floors  Patient will be able to explain general safety guidelines for exercising:   Before you start a new exercise program, talk with your health care provider.  Do not exercise so much that you hurt yourself, feel dizzy, or get very short of breath.  Wear comfortable clothes and wear shoes with good support.  Drink plenty of water while you exercise to prevent dehydration or heat stroke.  Work out until your breathing and your heartbeat get faster.       Personalized Health Maintenance & Screening Recommendations  Bone densitometry screening  Lung Cancer Screening Recommended: no (Low Dose CT Chest recommended if Age 83-80 years, 30 pack-year currently smoking OR have quit w/in past 15 years) Hepatitis C Screening recommended: yes HIV Screening recommended: no  Advanced Directives: Written information was not prepared per patient's request.  Referrals & Orders No orders of the defined types were placed in this encounter.   Follow-up Plan  Follow-up with Dettinger, Fransisca Kaufmann, MD as planned  Schedule Bone Density Scan     I have personally reviewed and noted the following in the patients chart:    Medical and social history  Use of alcohol, tobacco or illicit drugs   Current medications and supplements  Functional ability and status  Nutritional status  Physical activity  Advanced directives  List of other physicians  Hospitalizations, surgeries, and ER visits in previous 12 months  Vitals  Screenings to include cognitive, depression, and falls  Referrals and appointments  In  addition, I have reviewed and discussed with Marissa Gomez certain preventive protocols, quality metrics, and best practice recommendations. A written personalized care plan for preventive services as well as general preventive health recommendations is available and can be mailed to the patient at her request.      Baldomero Lamy, LPN  01/13/5808

## 2020-06-15 NOTE — Progress Notes (Signed)
Provider: [facility] Patient: HOME 

## 2020-09-09 IMAGING — MG DIGITAL DIAGNOSTIC UNILATERAL RIGHT MAMMOGRAM WITH TOMO AND CAD
4 series · 4 of 12 positions shown · non-contrast
Comparison: Previous exam(s).

CLINICAL DATA: 73-year-old female recalled from screening mammogram
dated 05/04/2019 for a possible right breast asymmetry.

EXAM:
DIGITAL DIAGNOSTIC UNILATERAL RIGHT MAMMOGRAM WITH CAD AND TOMO

[R ML synth-2D]
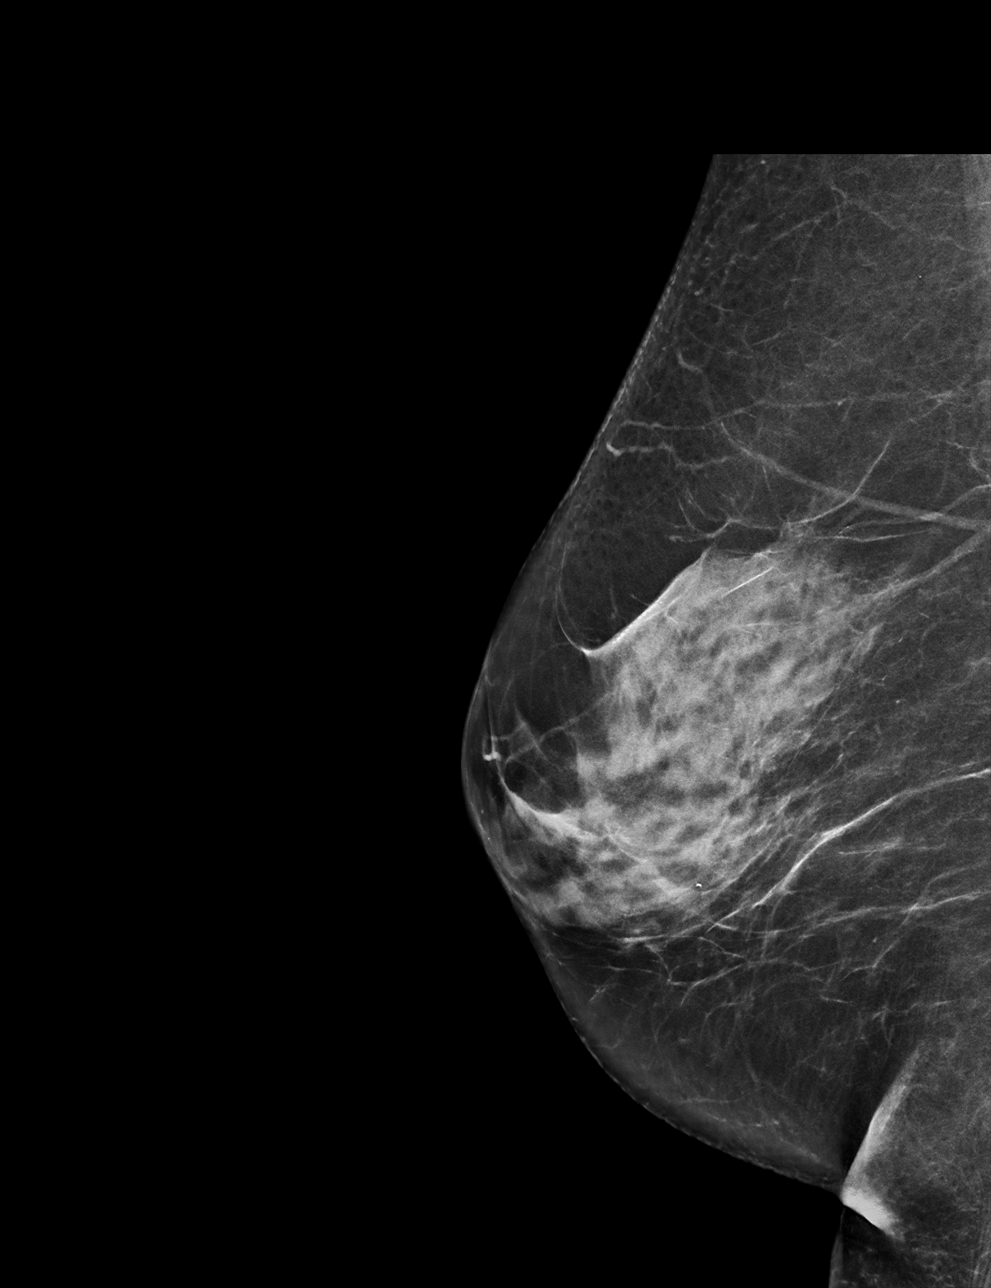

[R CC synth-2D]
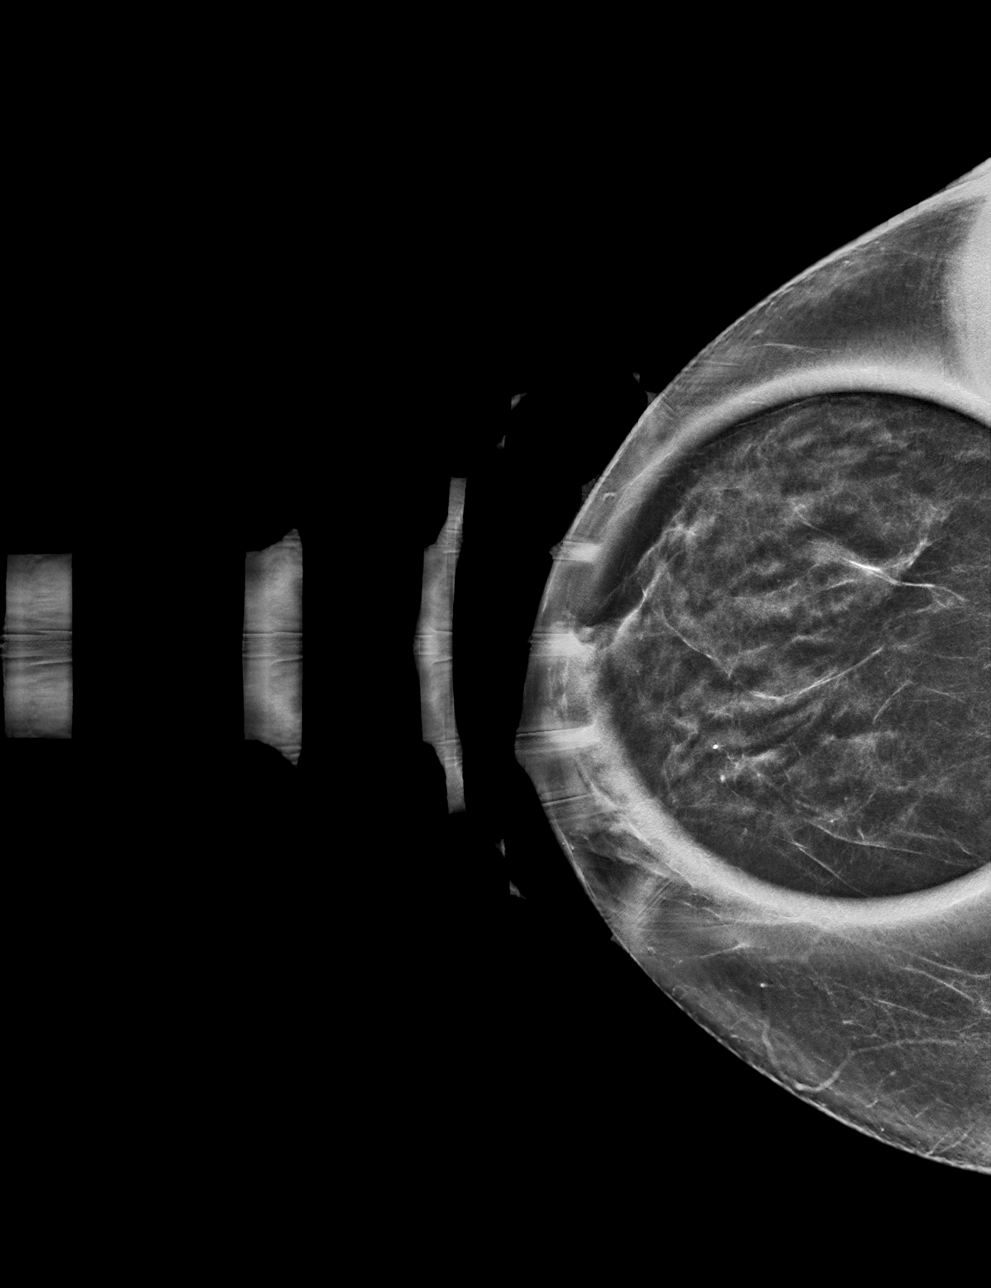

[R ML tomo · tomo slice 35/70.0]
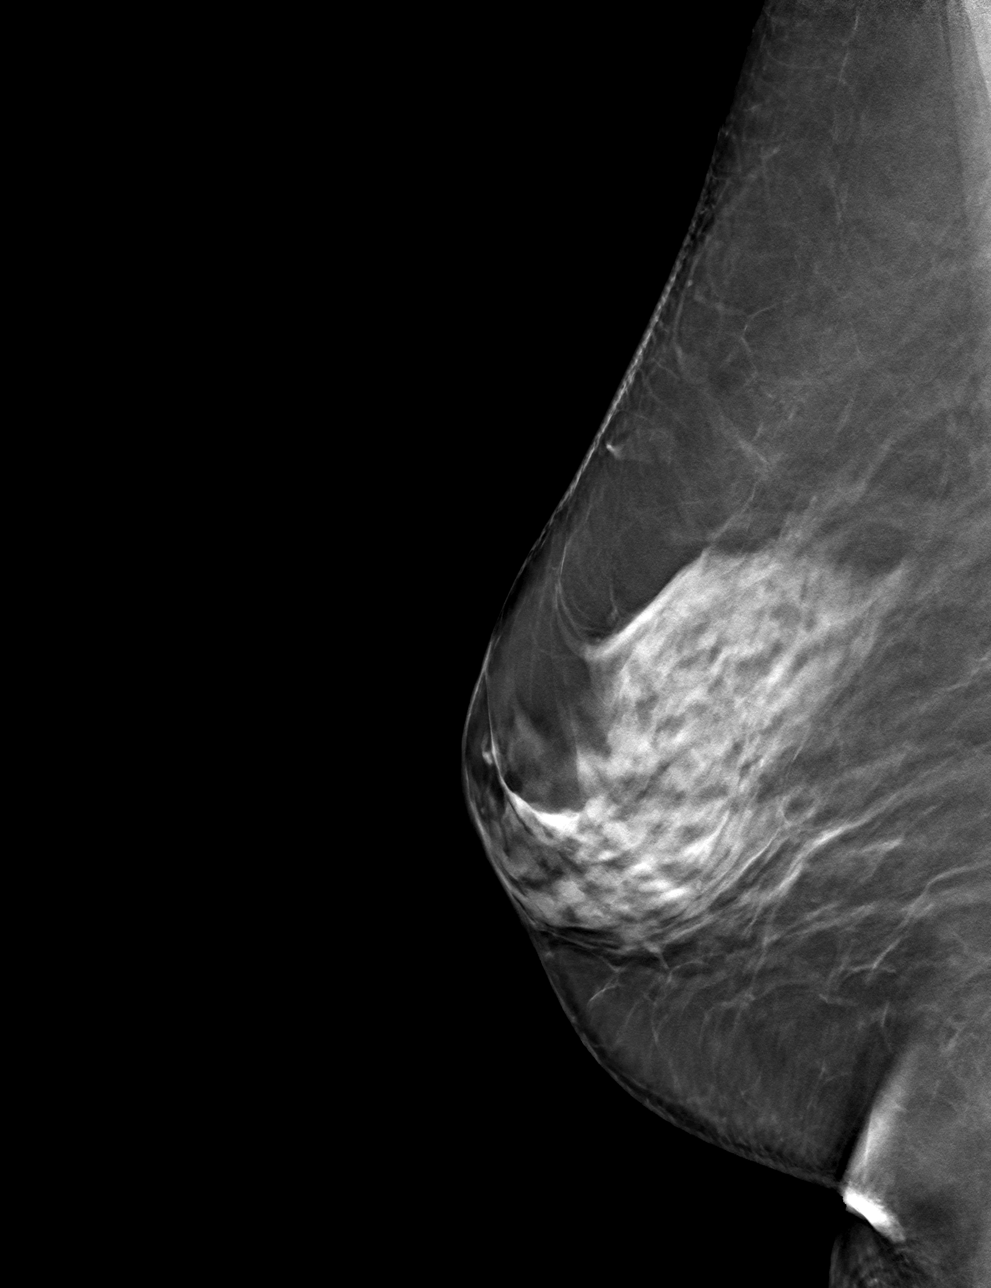

[R CC tomo · tomo slice 31/60.0]
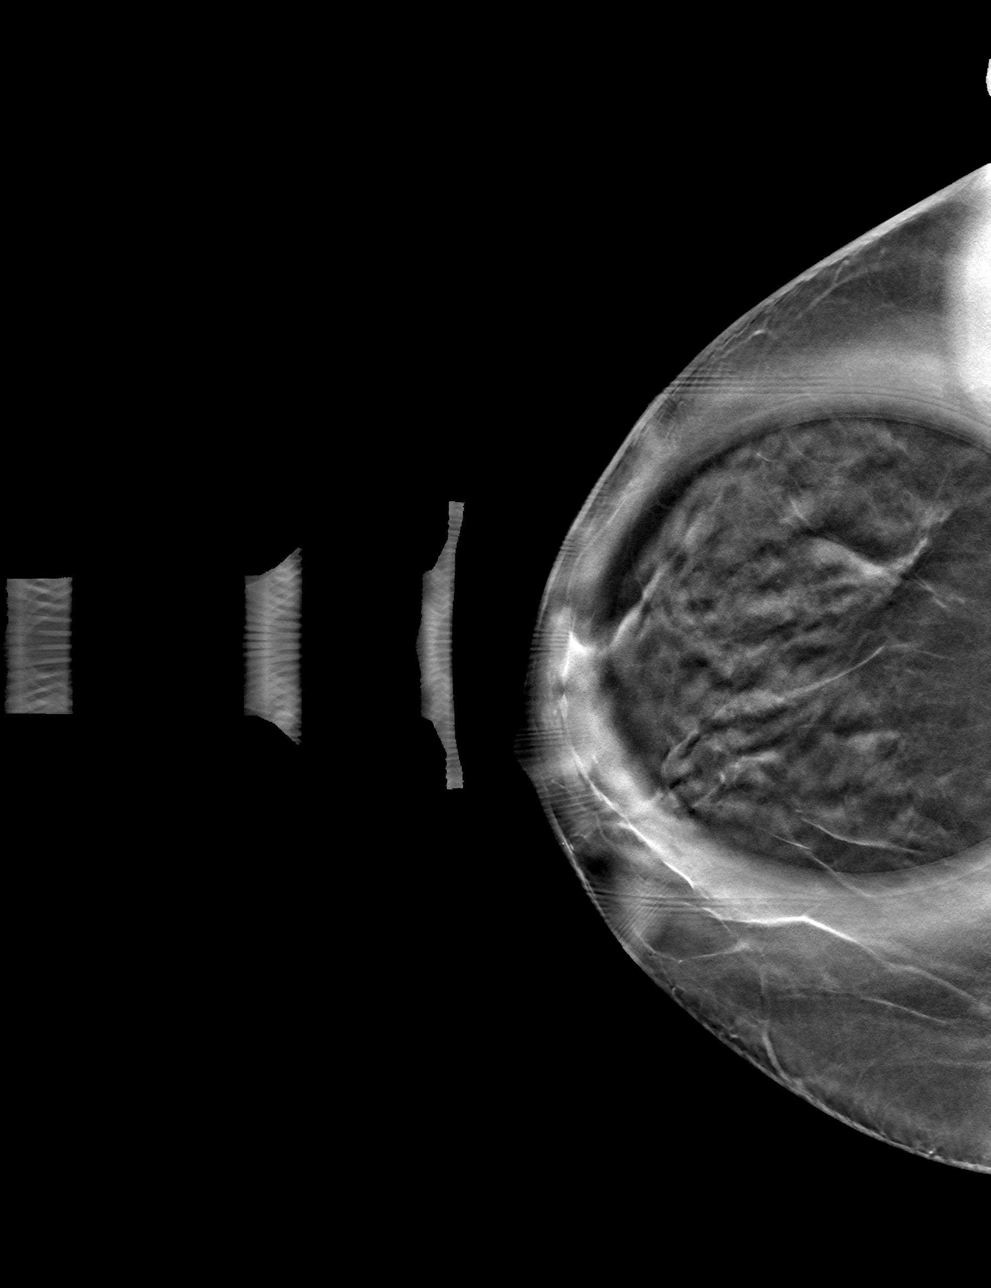

[4 of 12 positions shown; findings below may reference images not displayed]

ACR Breast Density Category c: The breast tissue is heterogeneously
dense, which may obscure small masses.
FINDINGS: Previously described, possible asymmetry in the lateral right breast
at posterior depth on the CC projection only resolves into well
dispersed fibroglandular tissue on today's additional views. The
appearance is similar dating back to multiple prior studies. No
suspicious findings are identified.

Mammographic images were processed with CAD.
IMPRESSION: No mammographic evidence of malignancy.

RECOMMENDATION:
Screening mammogram in one year.(Code:I9-S-HTA)

I have discussed the findings and recommendations with the patient.
Results were also provided in writing at the conclusion of the
visit. If applicable, a reminder letter will be sent to the patient
regarding the next appointment.

BI-RADS CATEGORY  1: Negative.

## 2020-09-27 ENCOUNTER — Ambulatory Visit (INDEPENDENT_AMBULATORY_CARE_PROVIDER_SITE_OTHER): Payer: Medicare Other

## 2020-09-27 ENCOUNTER — Other Ambulatory Visit: Payer: Self-pay

## 2020-09-27 DIAGNOSIS — Z23 Encounter for immunization: Secondary | ICD-10-CM

## 2021-06-12 ENCOUNTER — Ambulatory Visit (INDEPENDENT_AMBULATORY_CARE_PROVIDER_SITE_OTHER): Payer: Medicare Other | Admitting: *Deleted

## 2021-06-12 DIAGNOSIS — Z0000 Encounter for general adult medical examination without abnormal findings: Secondary | ICD-10-CM | POA: Diagnosis not present

## 2021-06-12 NOTE — Progress Notes (Signed)
MEDICARE ANNUAL WELLNESS VISIT  06/12/2021  Telephone Visit Disclaimer This Medicare AWV was conducted by telephone due to national recommendations for restrictions regarding the COVID-19 Pandemic (e.g. social distancing).  I verified, using two identifiers, that I am speaking with Marissa Gomez or their authorized healthcare agent. I discussed the limitations, risks, security, and privacy concerns of performing an evaluation and management service by telephone and the potential availability of an in-person appointment in the future. The patient expressed understanding and agreed to proceed.  Location of Patient: home Location of Provider (nurse):  office  Subjective:    Marissa Gomez is a 75 y.o. female patient of Dettinger, Fransisca Kaufmann, MD who had a Medicare Annual Wellness Visit today via telephone. Marissa Gomez is Retired and lives with their spouse. she has 2 children. she reports that she is socially active and does interact with friends/family regularly. she is not physically active and enjoys reading, gardening and working in her flowers.  Patient Care Team: Dettinger, Fransisca Kaufmann, MD as PCP - General (Family Medicine) Suella Broad, MD as Consulting Physician (Orthopedic Surgery) Vania Rea, MD as Consulting Physician (Obstetrics and Gynecology)  Advanced Directives 06/12/2021 06/08/2020 06/08/2019 06/03/2018 06/02/2017 08/24/2015  Does Patient Have a Medical Advance Directive? No No No No Yes No  Type of Advance Directive - - - - Living will -  Would patient like information on creating a medical advance directive? No - Patient declined No - Patient declined No - Patient declined No - Patient declined - Yes - Educational materials given    Hospital Utilization Over the Past 12 Months: # of hospitalizations or ER visits: 0 # of surgeries: 0  Review of Systems    Patient reports that her overall health is unchanged compared to last year.  History obtained from chart review and the  patient  Patient Reported Readings (BP, Pulse, CBG, Weight, etc) none  Pain Assessment       Current Medications & Allergies (verified) Allergies as of 06/12/2021       Reactions   Penicillins Rash        Medication List        Accurate as of June 12, 2021  8:58 AM. If you have any questions, ask your nurse or doctor.          alendronate 70 MG tablet Commonly known as: FOSAMAX Take 70 mg by mouth once a week. Take with a full glass of water on an empty stomach.   CALCIUM 1200 PO Take by mouth.   CENTRUM SILVER ADULT 50+ PO Take by mouth.   cholecalciferol 1000 units tablet Commonly known as: VITAMIN D Take 1,000 Units by mouth daily.   melatonin 1 MG Tabs tablet Take 1 tablet by mouth at bedtime.   OMEGA-3 FISH OIL PO omega-3s 720 mg-dha 300 mg-epa 360 mg-fish oil 1,200 mg capsule  Take by oral route.   Red Yeast Rice 600 MG Caps Take by mouth.        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 2 sons. Enjoys reading.   Social Determinants of Health   Financial Resource Strain: Low Risk    Difficulty of Paying Living Expenses: Not hard at all  Food Insecurity: No Food Insecurity   Worried About Charity fundraiser in the Last Year: Never true   [place] in the Last Year: Never true  Transportation Needs: No Transportation Needs   Lack of Transportation (Medical): No   Lack of Transportation (Non-Medical): No  Physical Activity: Inactive   Days of Exercise per Week: 0 days   Minutes of Exercise per Session: 0 min  Stress: No Stress Concern Present   Feeling of Stress : Not at all  Social Connections: Socially Integrated   Frequency of Communication with Friends and Family: More than three times a week   Frequency of Social Gatherings with Friends and Family: More than three times a week   Attends Religious Services: More than 4 times per year   Active Member of [employer] or Organizations: Yes   Attends Archivist Meetings: More than 4 times per year   Marital Status: Married    Activities of Daily Living In your present state of health, do you have any difficulty performing the following activities: 06/12/2021  Hearing? N  Vision? Y  Comment wears rx glasses all the time  Difficulty concentrating or making decisions? N  Walking or climbing stairs? N  Dressing or bathing? N  Doing errands, shopping? N  Preparing Food and eating ? N  Using the Toilet? N  In the past six months, have you accidently leaked urine? Y  Comment wears a pantyliner when she leaves the house  Do you have problems with loss of bowel control? N  Managing your Medications? N  Managing your Finances? N  Housekeeping or managing your Housekeeping? N  Some recent data might be hidden    Patient Education/ Literacy How often do you need to have someone help you when you read instructions, pamphlets, or other written materials from your doctor or  pharmacy?: 1 - Never What is the last grade level you completed in school?: 12th Grade  Exercise Current Exercise Habits: The patient does not participate in regular exercise at present, Exercise limited by: None identified  Diet Patient reports consuming 3 meals a day and 2 snack(s) a day Patient reports that her primary diet is: Regular Patient reports that she does have regular access to food.   Depression Screen PHQ 2/9 Scores 06/12/2021 06/08/2020 09/01/2019 06/08/2019 02/03/2019 01/12/2019 06/03/2018  PHQ - 2 Score 0 0 0 0 0 0 0     Fall Risk Fall Risk  06/12/2021 06/08/2020 09/01/2019 06/08/2019 01/12/2019  Falls  in the past year? 0 0 0 0 0  Number falls in past yr: - - - - -  Injury with Fall? - - - - -  Risk for fall due to : - - - - -  Follow up - - - - -     Objective:  Marissa Gomez seemed alert and oriented and she participated appropriately during our telephone visit.  Blood Pressure Weight BMI  BP Readings from Last 3 Encounters:  09/01/19 115/64  02/03/19 123/76  01/12/19 125/73   Wt Readings from Last 3 Encounters:  09/01/19 161 lb (73 kg)  02/03/19 165 lb 6.4 oz (75 kg)  01/12/19 163 lb 6.4 oz (74.1 kg)   BMI Readings from Last 1 Encounters:  09/01/19 27.85 kg/m    *Unable to obtain current vital signs, weight, and BMI due to telephone visit type  Hearing/Vision  Kandi did not seem to have difficulty with hearing/understanding during the telephone conversation Reports that she has had a formal eye exam by an eye care professional within the past year Reports that she has not had a formal hearing evaluation within the past year *Unable to fully assess hearing and vision during telephone visit type  Cognitive Function: 6CIT Screen 06/12/2021 06/08/2020 06/08/2019  What Year? 0 points 0 points 0 points  What month? 0 points 0 points 0 points  What time? 0 points 0 points 0 points  Count back from 20 0 points 0 points 0 points  Months in reverse 0 points 0 points 0  points  Repeat phrase 0 points 0 points 2 points  Total Score 0 0 2   (Normal:0-7, Significant for Dysfunction: >8)  Normal Cognitive Function Screening: Yes   Immunization & Health Maintenance Record Immunization History  Administered Date(s) Administered   Fluad Quad(high Dose 65+) 10/07/2019, 09/27/2020   Influenza, High Dose Seasonal PF 10/21/2014, 10/28/2015, 10/16/2016, 10/06/2017, 10/06/2018   Influenza,inj,Quad PF,6+ Mos 10/14/2013   Influenza-Unspecified 10/09/2014, 10/09/2016   Pneumococcal Conjugate-13 08/24/2015   Pneumococcal Polysaccharide-23 06/02/2017   Tdap 06/03/2018   Zoster Recombinat (Shingrix) 08/13/2018, 02/03/2019   Zoster, Live 04/14/2012    Health Maintenance  Topic Date Due   Hepatitis C Screening  Never done   DEXA SCAN  09/19/2018   COVID-19 Vaccine (3 - Booster) 06/27/2020   MAMMOGRAM  06/05/2021   INFLUENZA VACCINE  07/09/2021   TETANUS/TDAP  06/03/2028   PNA vac Low Risk Adult  Completed   Zoster Vaccines- Shingrix  Completed   HPV VACCINES  Aged Out       Assessment  This is a routine wellness examination for [employer].  Health Maintenance: Due or Overdue Health Maintenance Due  Topic Date Due   Hepatitis C Screening  Never done   DEXA SCAN  09/19/2018   COVID-19 Vaccine (3 - Booster) 06/27/2020   MAMMOGRAM  06/05/2021    Marissa Gomez does not need a referral for Community Assistance: Care Management:   no Social Work:    no Prescription Assistance:  no Nutrition/Diabetes Education:  no   Plan:  Personalized Goals  Goals Addressed             This Visit's Progress    DIET - EAT MORE FRUITS AND VEGETABLES         Personalized Health Maintenance & Screening Recommendations  Screening mammography Bone densitometry screening Advanced directives: has NO advanced directive - not interested in additional information  Lung Cancer Screening Recommended: no (Low Dose CT  Chest recommended if Age 56-80 years, 30  pack-year currently smoking OR have quit w/in past 15 years) Hepatitis C Screening recommended: no HIV Screening recommended: no  Advanced Directives: Written information was not prepared per patient's request.  Referrals & Orders No orders of the defined types were placed in this encounter.   Follow-up Plan Follow-up with Dettinger, Fransisca Kaufmann, MD as planned Schedule your screening Mammogram as discussed Keep your appointment with your GYN and your DEXA scan   I have personally reviewed and noted the following in the patient's chart:   Medical and social history Use of alcohol, tobacco or illicit drugs  Current medications and supplements Functional ability and status Nutritional status Physical activity Advanced directives List of other physicians Hospitalizations, surgeries, and ER visits in previous 12 months Vitals Screenings to include cognitive, depression, and falls Referrals and appointments  In addition, I have reviewed and discussed with Marissa Gomez certain preventive protocols, quality metrics, and best practice recommendations. A written personalized care plan for preventive services as well as general preventive health recommendations is available and can be mailed to the patient at her request.      Milas Hock , LPN  08/16/2819

## 2021-06-18 DIAGNOSIS — Z0000 Encounter for general adult medical examination without abnormal findings: Secondary | ICD-10-CM | POA: Diagnosis not present

## 2021-06-18 DIAGNOSIS — Z1231 Encounter for screening mammogram for malignant neoplasm of breast: Secondary | ICD-10-CM | POA: Diagnosis not present

## 2021-08-07 DIAGNOSIS — E7800 Pure hypercholesterolemia, unspecified: Secondary | ICD-10-CM | POA: Diagnosis not present

## 2021-08-07 DIAGNOSIS — R7303 Prediabetes: Secondary | ICD-10-CM | POA: Diagnosis not present

## 2021-10-04 ENCOUNTER — Ambulatory Visit: Payer: Medicare Other

## 2021-10-05 ENCOUNTER — Other Ambulatory Visit: Payer: Self-pay

## 2021-10-05 ENCOUNTER — Ambulatory Visit (INDEPENDENT_AMBULATORY_CARE_PROVIDER_SITE_OTHER): Payer: Medicare Other

## 2021-10-05 DIAGNOSIS — Z23 Encounter for immunization: Secondary | ICD-10-CM | POA: Diagnosis not present

## 2021-11-05 ENCOUNTER — Ambulatory Visit (INDEPENDENT_AMBULATORY_CARE_PROVIDER_SITE_OTHER): Payer: Medicare Other | Admitting: Nurse Practitioner

## 2021-11-05 ENCOUNTER — Encounter: Payer: Self-pay | Admitting: Nurse Practitioner

## 2021-11-05 VITALS — BP 124/74 | HR 84 | Temp 97.7°F | Ht 63.0 in | Wt 163.0 lb

## 2021-11-05 DIAGNOSIS — R21 Rash and other nonspecific skin eruption: Secondary | ICD-10-CM | POA: Diagnosis not present

## 2021-11-05 MED ORDER — PREDNISONE 10 MG (21) PO TBPK: ORAL_TABLET | ORAL | 0 refills | Status: DC

## 2021-11-05 MED ORDER — HYDROCORTISONE 1 % EX LOTN: 1.0000 "application " | TOPICAL_LOTION | Freq: Two times a day (BID) | CUTANEOUS | 0 refills | Status: DC

## 2021-11-05 NOTE — Patient Instructions (Signed)
Rash, Adult  A rash is a change in the color of your skin. A rash can also change the way your skin feels. There are many different conditions and factors that can causea rash. Follow these instructions at home: The goal of treatment is to stop the itching and keep the rash from spreading. Watch for any changes in your symptoms. Let your doctor know about them. Followthese instructions to help with your condition: Medicine Take or apply over-the-counter and prescription medicines only as told by your doctor. These may include medicines: To treat red or swollen skin (corticosteroid creams). To treat itching. To treat an allergy (oral antihistamines). To treat very bad symptoms (oral corticosteroids).  Skin care Put cool cloths (compresses) on the affected areas. Do not scratch or rub your skin. Avoid covering the rash. Make sure that the rash is exposed to air as much as possible. Managing itching and discomfort Avoid hot showers or baths. These can make itching worse. A cold shower may help. Try taking a bath with: Epsom salts. You can get these at your local pharmacy or grocery store. Follow the instructions on the package. Baking soda. Pour a small amount into the bath as told by your doctor. Colloidal oatmeal. You can get this at your local pharmacy or grocery store. Follow the instructions on the package. Try putting baking soda paste onto your skin. Stir water into baking soda until it gets like a paste. Try putting on a lotion that relieves itchiness (calamine lotion). Keep cool and out of the sun. Sweating and being hot can make itching worse. General instructions  Rest as needed. Drink enough fluid to keep your pee (urine) pale yellow. Wear loose-fitting clothing. Avoid scented soaps, detergents, and perfumes. Use gentle soaps, detergents, perfumes, and other cosmetic products. Avoid anything that causes your rash. Keep a journal to help track what causes your rash. Write  down: What you eat. What cosmetic products you use. What you drink. What you wear. This includes jewelry. Keep all follow-up visits as told by your doctor. This is important.  Contact a doctor if: You sweat at night. You lose weight. You pee (urinate) more than normal. You pee less than normal, or you notice that your pee is a darker color than normal. You feel weak. You throw up (vomit). Your skin or the whites of your eyes look yellow (jaundice). Your skin: Tingles. Is numb. Your rash: Does not go away after a few days. Gets worse. You are: More thirsty than normal. More tired than normal. You have: New symptoms. Pain in your belly (abdomen). A fever. Watery poop (diarrhea). Get help right away if: You have a fever and your symptoms suddenly get worse. You start to feel mixed up (confused). You have a very bad headache or a stiff neck. You have very bad joint pains or stiffness. You have jerky movements that you cannot control (seizure). Your rash covers all or most of your body. The rash may or may not be painful. You have blisters that: Are on top of the rash. Grow larger. Grow together. Are painful. Are inside your nose or mouth. You have a rash that: Looks like purple pinprick-sized spots all over your body. Has a "bull's eye" or looks like a target. Is red and painful, causes your skin to peel, and is not from being in the sun too long. Summary A rash is a change in the color of your skin. A rash can also change the way your skin feels.   The goal of treatment is to stop the itching and keep the rash from spreading. Take or apply over-the-counter and prescription medicines only as told by your doctor. Contact a doctor if you have new symptoms or symptoms that get worse. Keep all follow-up visits as told by your doctor. This is important. This information is not intended to replace advice given to you by your health care provider. Make sure you discuss any  questions you have with your healthcare provider. Document Revised: 03/19/2019 Document Reviewed: 06/29/2018 Elsevier Patient Education  2022 Elsevier Inc.  

## 2021-11-05 NOTE — Assessment & Plan Note (Signed)
Unresolved rash present on  right and left lower back for 3 weeks.  Patient is not experiencing any pain or burning but rash eruptions are extremely pruritic.  On assessment and rule out shingles outbreak due to presentation of rash being bilateral not  Unilateral and location.  Advised patient to keep skin moisturized, avoid hot showers, apply cool compress, Hydrocortisone cream 1% topical, Prednisone taper Follow-up with unresolved symptoms.  Education provided to patient printed handouts given.  Patient verbalized understanding.  Rx sent to pharmacy.

## 2021-11-05 NOTE — Progress Notes (Signed)
Acute Office Visit  Subjective:    Patient ID: Marissa Gomez, female    DOB: Mar 13, 1946, 75 y.o.   MRN: [id number]  Chief Complaint  Patient presents with   Herpes Zoster    Rash This is a new problem. The current episode started 1 to 4 weeks ago. The problem has been gradually improving since onset. Location: back. The rash is characterized by dryness, redness and itchiness. She was exposed to nothing. Pertinent negatives include no congestion, cough, shortness of breath or sore throat. Past treatments include anti-itch cream. The treatment provided mild relief.    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 2 sons. Enjoys reading.   Social Determinants of Health   Financial Resource Strain: Low Risk    Difficulty of Paying Living Expenses: Not hard at all  Food Insecurity: No Food Insecurity   Worried About Charity fundraiser in the Last Year: Never true   [place] in the Last Year: Never  true  Transportation Needs: No Transportation Needs   Lack of Transportation (Medical): No   Lack of Transportation (Non-Medical): No  Physical Activity: Inactive   Days of Exercise per Week: 0 days   Minutes of Exercise per Session: 0 min  Stress: No Stress Concern Present   Feeling of Stress : Not at all  Social Connections: Socially Integrated   Frequency of Communication with Friends and Family: More than three times a week   Frequency of Social Gatherings with Friends and Family: More than three times a week   Attends Religious Services: More than 4 times per year   Active Member of [employer] or Organizations: Yes   Attends Music therapist: More than 4 times per year   Marital Status: Married  Human resources officer Violence: Not At Risk   Fear of Current or Ex-Partner: No   Emotionally Abused: No   Physically Abused: No   Sexually Abused: No    Outpatient Medications Prior to Visit  Medication Sig Dispense Refill   alendronate (FOSAMAX) 70 MG tablet Take 70 mg by mouth once a week. Take with a full glass of water on an empty stomach.     Calcium Carbonate-Vit D-Min (CALCIUM 1200 PO) Take  by mouth.     cholecalciferol (VITAMIN D) 1000 units tablet Take 1,000 Units by mouth daily.     Melatonin 1 MG TABS Take 1 tablet by mouth at bedtime.     Multiple Vitamins-Minerals (CENTRUM SILVER ADULT 50+ PO) Take by mouth.     Omega-3 Fatty Acids (OMEGA-3 FISH OIL PO) omega-3s 720 mg-dha 300 mg-epa 360 mg-fish oil 1,200 mg capsule  Take by oral route.     Red Yeast Rice 600 MG CAPS Take by mouth.     No facility-administered medications prior to visit.    Allergies  Allergen Reactions   Penicillins Rash    Review of Systems  Constitutional: Negative.   HENT:  Negative for congestion, sinus pressure, sinus pain and sore throat.   Respiratory:  Negative for cough and shortness of breath.   Cardiovascular: Negative.   Skin:  Positive for color change and rash.  All other  systems reviewed and are negative.     Objective:    Physical Exam Vitals and nursing note reviewed.  Constitutional:      Appearance: Normal appearance.  HENT:     Head: Normocephalic.     Right Ear: Ear canal and external ear normal.     Left Ear: Ear canal and external ear normal.     Nose: Nose normal.     Mouth/Throat:     Mouth: Mucous membranes are moist.     Pharynx: Oropharynx is clear.  Eyes:     Conjunctiva/sclera: Conjunctivae normal.  Cardiovascular:     Rate and Rhythm: Normal rate and regular rhythm.     Pulses: Normal pulses.     Heart sounds: Normal heart sounds.  Pulmonary:     Effort: Pulmonary effort is normal.     Breath sounds: Normal breath sounds.  Abdominal:     General: Bowel sounds are normal.  Skin:    General: Skin is warm and dry.     Findings: Erythema and rash present. Rash is crusting.  Neurological:     Mental Status: She is alert and oriented to person, place, and time.    BP 124/74   Pulse 84   Temp 97.7 F (36.5 C)   Ht 5\' 3"  (1.6 m)   Wt 163 lb (73.9 kg)   SpO2 97%   BMI 28.87 kg/m  Wt Readings from Last 3 Encounters:  11/05/21 163 lb (73.9 kg)  09/01/19 161 lb (73 kg)  02/03/19 165 lb 6.4 oz (75 kg)    Health Maintenance Due  Topic Date Due   Hepatitis C Screening  Never done   DEXA SCAN  09/19/2018    There are no preventive care reminders to display for this patient.      Assessment & Plan:   Problem List Items Addressed This Visit       Musculoskeletal and Integument   Rash - Primary    Unresolved rash present on  right and left lower back for 3 weeks.  Patient is not experiencing any pain or burning but rash eruptions are extremely pruritic.  On assessment and rule out shingles outbreak due to presentation of rash being bilateral not  Unilateral and location.  Advised patient to keep skin moisturized, avoid hot showers, apply cool compress, Hydrocortisone cream 1% topical, Prednisone taper Follow-up  with unresolved symptoms.  Education provided to patient printed handouts given.  Patient verbalized understanding.  Rx sent to pharmacy.       Relevant Medications   predniSONE (STERAPRED  UNI-PAK 21 TAB) 10 MG (21) TBPK tablet   hydrocortisone 1 % lotion     Meds ordered this encounter  Medications   predniSONE (STERAPRED UNI-PAK 21 TAB) 10 MG (21) TBPK tablet    Sig: 6 tablets day 1, 5 tablet day 2, 4 tablet day 3, 3 tablet before, 2 tablet day 5, 1 tablet day 6    Dispense:  1 each    Refill:  0    Order Specific Question:   Supervising Provider    Answer:   Jeneen Rinks   hydrocortisone 1 % lotion    Sig: Apply 1 application topically 2 (two) times daily.    Dispense:  118 mL    Refill:  0    Order Specific Question:   Supervising Provider    Answer:   Claretta Fraise [[id number]]     Ivy Lynn, NP

## 2021-12-14 ENCOUNTER — Ambulatory Visit (INDEPENDENT_AMBULATORY_CARE_PROVIDER_SITE_OTHER): Payer: Medicare Other | Admitting: Family Medicine

## 2021-12-14 ENCOUNTER — Encounter: Payer: Self-pay | Admitting: Family Medicine

## 2021-12-14 VITALS — BP 135/76 | HR 85 | Temp 98.4°F | Ht 63.0 in | Wt 163.0 lb

## 2021-12-14 DIAGNOSIS — U071 COVID-19: Secondary | ICD-10-CM | POA: Diagnosis not present

## 2021-12-14 MED ORDER — MOLNUPIRAVIR EUA 200MG CAPSULE: 4.0000 | ORAL_CAPSULE | Freq: Two times a day (BID) | ORAL | 0 refills | Status: DC

## 2021-12-14 NOTE — Progress Notes (Signed)
Subjective:  Patient ID: Marissa Gomez, female    DOB: 03/14/1946, 76 y.o.   MRN: [id number]  Patient Care Team: Dettinger, Fransisca Kaufmann, MD as PCP - General (Family Medicine) Suella Broad, MD as Consulting Physician (Orthopedic Surgery) Vania Rea, MD as Consulting Physician (Obstetrics and Gynecology)   Chief Complaint:  Covid Positive   HPI: Marissa Gomez is a 76 y.o. female presenting on 12/14/2021 for Covid Positive   Patient presents today with complaints of testing positive for COVID.  States her husband tested positive three days ago.  States she developed a sore throat and slight congestion 2 days ago.  States she has been taking Tylenol with minimal relief of symptoms. She would like to start antiviral therapy.  She denies any significant cough or congestion.  No fever or chills.  No weakness, confusion, fatigue, decreased urine output, decreased appetite, nausea, vomiting, diarrhea, or abdominal pain.    Relevant past medical, surgical, family, and social history reviewed and updated as indicated.  Allergies and medications reviewed and updated. Data reviewed: Chart in Epic.   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 2 sons. Enjoys reading.   Social Determinants of Health   Financial Resource Strain: Low Risk    Difficulty of Paying Living Expenses: Not hard at all  Food Insecurity: No Food Insecurity   Worried About Charity fundraiser in the Last Year: Never true   [place] in the Last Year: Never true  Transportation Needs: No Transportation Needs   Lack of Transportation (Medical): No   Lack of Transportation (Non-Medical): No  Physical Activity: Inactive   Days of Exercise per Week: 0 days   Minutes of Exercise per Session: 0 min  Stress: No Stress Concern Present   Feeling of Stress : Not at all  Social Connections: Socially Integrated   Frequency of Communication with Friends and Family: More than three times a week   Frequency of Social Gatherings with Friends and Family: More than three times a week   Attends Religious Services: More than 4 times per year   Active Member of [employer] or Organizations: Yes   Attends Music therapist: More than 4 times per year   Marital Status: Married  Human resources officer Violence: Not At Risk   Fear of Current or Ex-Partner: No   Emotionally Abused: No   Physically Abused:  No   Sexually Abused: No    Outpatient Encounter Medications as of 12/14/2021  Medication Sig   molnupiravir EUA (LAGEVRIO) 200 mg CAPS capsule Take 4 capsules (800 mg total) by mouth 2 (two) times daily for 5 days.   [DISCONTINUED] predniSONE (STERAPRED UNI-PAK 21 TAB) 10 MG (21) TBPK tablet 6 tablets day 1, 5 tablet day 2, 4 tablet day 3, 3 tablet before, 2 tablet day 5, 1 tablet day 6   alendronate (FOSAMAX) 70 MG tablet Take 70 mg by mouth once a week. Take with a full glass of water on an empty stomach.   Calcium Carbonate-Vit D-Min (CALCIUM 1200 PO) Take by mouth.   cholecalciferol (VITAMIN D) 1000 units tablet Take 1,000 Units by  mouth daily.   hydrocortisone 1 % lotion Apply 1 application topically 2 (two) times daily.   Melatonin 1 MG TABS Take 1 tablet by mouth at bedtime.   Multiple Vitamins-Minerals (CENTRUM SILVER ADULT 50+ PO) Take by mouth.   Omega-3 Fatty Acids (OMEGA-3 FISH OIL PO) omega-3s 720 mg-dha 300 mg-epa 360 mg-fish oil 1,200 mg capsule  Take by oral route.   rosuvastatin (CRESTOR) 10 MG tablet Take 10 mg by mouth at bedtime.   [DISCONTINUED] Red Yeast Rice 600 MG CAPS Take by mouth.   No facility-administered encounter medications on file as of 12/14/2021.    Allergies  Allergen Reactions   Penicillins Rash    Review of Systems  Constitutional:  Negative for activity change, appetite change, chills, diaphoresis, fatigue, fever and unexpected weight change.  HENT:  Positive for congestion, rhinorrhea and sore throat. Negative for dental problem, drooling, ear discharge, ear pain, facial swelling, hearing loss, mouth sores, nosebleeds, postnasal drip, sinus pressure, sinus pain, sneezing, tinnitus, trouble swallowing and voice change.   Eyes: Negative.   Respiratory:  Negative for cough, chest tightness and shortness of breath.   Cardiovascular:  Negative for chest pain, palpitations and leg swelling.  Gastrointestinal:  Negative for abdominal pain, blood in stool, constipation, diarrhea, nausea and vomiting.  Endocrine: Negative.   Genitourinary:  Negative for decreased urine volume, difficulty urinating, dyspareunia, dysuria, frequency and urgency.  Musculoskeletal:  Negative for arthralgias and myalgias.  Skin: Negative.   Allergic/Immunologic: Negative.   Neurological:  Negative for dizziness, tremors, seizures, syncope, facial asymmetry, speech difficulty, weakness, light-headedness, numbness and headaches.  Hematological: Negative.   Psychiatric/Behavioral:  Negative for confusion, hallucinations, sleep disturbance and suicidal ideas.   All other systems reviewed and are negative.       Objective:  BP 135/76    Pulse 85    Temp 98.4 F (36.9 C)    Ht 5\' 3"  (1.6 m)    Wt 163 lb (73.9 kg)    SpO2 96%    BMI 28.87 kg/m    Wt Readings from Last 3 Encounters:  12/14/21 163 lb (73.9 kg)  11/05/21 163 lb (73.9 kg)  09/01/19 161 lb (73 kg)    Physical Exam Vitals and nursing note reviewed.  Constitutional:      General: She is not in acute distress.    Appearance: Normal appearance. She is not ill-appearing, toxic-appearing or diaphoretic.  HENT:     Head: Normocephalic and atraumatic.     Right Ear: Tympanic membrane, ear canal and external ear normal.     Left Ear: Tympanic membrane, ear canal and external ear normal.     Nose: Nose normal.     Mouth/Throat:     Mouth: Mucous membranes are moist.  Pharynx: Oropharynx is clear. Posterior oropharyngeal erythema present. No oropharyngeal exudate.  Eyes:     Conjunctiva/sclera: Conjunctivae normal.     Pupils: Pupils are equal, round, and reactive to light.  Cardiovascular:     Rate and Rhythm: Normal rate and regular rhythm.     Heart sounds: Normal heart sounds.  Pulmonary:     Effort: Pulmonary effort is normal.     Breath sounds: Normal breath sounds.  Abdominal:     General: Bowel sounds are normal.     Palpations: Abdomen is soft.  Musculoskeletal:     Cervical back: Neck supple.     Right lower leg: No edema.     Left lower leg: No edema.  Lymphadenopathy:     Cervical: No cervical adenopathy.  Skin:    General: Skin is warm and dry.     Capillary Refill: Capillary refill takes less than 2 seconds.  Neurological:     General: No focal deficit present.     Mental Status: She is alert and oriented to person, place, and time.  Psychiatric:        Mood and Affect: Mood normal.        Behavior: Behavior normal.        Thought Content: Thought content normal.        Judgment: Judgment normal.    Results for orders placed or performed in visit on 09/01/19  Urine Culture   Specimen: Urine   UR   Result Value Ref Range   Urine Culture, Routine Final report (A)    Organism ID, Bacteria Escherichia coli (A)    Antimicrobial Susceptibility Comment   Microscopic Examination   URINE  Result Value Ref Range   WBC, UA >30 (A) 0 - 5 /hpf   RBC 3-10 (A) 0 - 2 /hpf   Epithelial Cells (non renal) 0-10 0 - 10 /hpf   Renal Epithel, UA 0-10 (A) None seen /hpf   Bacteria, UA Many (A) None seen/Few  Urinalysis, Complete  Result Value Ref Range   Specific Gravity, UA 1.010 1.005 - 1.030   pH, UA 7.0 5.0 - 7.5   Color, UA Yellow Yellow   Appearance Ur Clear Clear   Leukocytes,UA 3+ (A) Negative   Protein,UA Negative Negative/Trace   Glucose, UA Negative Negative   Ketones, UA Negative Negative   RBC, UA 3+ (A) Negative   Bilirubin, UA Negative Negative   Urobilinogen, Ur 0.2 0.2 - 1.0 mg/dL   Nitrite, UA Negative Negative   Microscopic Examination See below:        Pertinent labs & imaging results that were available during my care of the patient were reviewed by me and considered in my medical decision making.  Assessment & Plan:  S[place]na was seen today for covid positive.  Diagnoses and all orders for this visit:  Positive self-administered antigen test for COVID-19 COVID-positive. Start antiviral therapy and continue symptomatic care as discussed. Report any new, worsening, or persistent symptoms.    Continue all other maintenance medications.  Follow up plan: Return if symptoms worsen or fail to improve.   Continue healthy lifestyle choices, including diet (rich in fruits, vegetables, and lean proteins, and low in salt and simple carbohydrates) and exercise (at least 30 minutes of moderate physical activity daily).  Educational handout given for things to do to manage COVID at home  The above assessment and management plan was discussed with the patient. The patient verbalized understanding of and has agreed to the  management plan. Patient is aware to call the clinic if  they develop any new symptoms or if symptoms persist or worsen. Patient is aware when to return to the clinic for a follow-up visit. Patient educated on when it is appropriate to go to the emergency department.   Monia Pouch, FNP-C [facility] [phone]

## 2022-05-08 DIAGNOSIS — H2513 Age-related nuclear cataract, bilateral: Secondary | ICD-10-CM | POA: Diagnosis not present

## 2022-05-08 DIAGNOSIS — H04123 Dry eye syndrome of bilateral lacrimal glands: Secondary | ICD-10-CM | POA: Diagnosis not present

## 2022-05-08 DIAGNOSIS — H11003 Unspecified pterygium of eye, bilateral: Secondary | ICD-10-CM | POA: Diagnosis not present

## 2022-05-28 DIAGNOSIS — H25812 Combined forms of age-related cataract, left eye: Secondary | ICD-10-CM | POA: Diagnosis not present

## 2022-05-28 DIAGNOSIS — H25811 Combined forms of age-related cataract, right eye: Secondary | ICD-10-CM | POA: Diagnosis not present

## 2022-05-28 DIAGNOSIS — Z01818 Encounter for other preprocedural examination: Secondary | ICD-10-CM | POA: Diagnosis not present

## 2022-06-06 DIAGNOSIS — H269 Unspecified cataract: Secondary | ICD-10-CM | POA: Diagnosis not present

## 2022-06-06 DIAGNOSIS — H25811 Combined forms of age-related cataract, right eye: Secondary | ICD-10-CM | POA: Diagnosis not present

## 2022-06-06 HISTORY — PX: EYE SURGERY: SHX253

## 2022-06-19 ENCOUNTER — Ambulatory Visit (INDEPENDENT_AMBULATORY_CARE_PROVIDER_SITE_OTHER): Payer: Medicare Other

## 2022-06-19 VITALS — Ht 63.5 in | Wt 163.0 lb

## 2022-06-19 DIAGNOSIS — Z0000 Encounter for general adult medical examination without abnormal findings: Secondary | ICD-10-CM | POA: Diagnosis not present

## 2022-06-19 NOTE — Progress Notes (Signed)
Subjective:   Marissa Gomez is a 76 y.o. female who presents for Medicare Annual (Subsequent) preventive examination. Virtual Visit via Telephone Note  I connected with  Marissa Gomez on 06/19/22 at  9:45 AM EDT by telephone and verified that I am speaking with the correct person using two identifiers.  Location: Patient: HOME Provider: [facility] Persons participating in the virtual visit: patient/Nurse Health Advisor   I discussed the limitations, risks, security and privacy concerns of performing an evaluation and management service by telephone and the availability of in person appointments. The patient expressed understanding and agreed to proceed.  Interactive audio and video telecommunications were attempted between this nurse and patient, however failed, due to patient having technical difficulties OR patient did not have access to video capability.  We continued and completed visit with audio only.  Some vital signs may be absent or patient reported.   Marissa Driver, LPN  Review of Systems     Cardiac Risk Factors include: advanced age (>56mn, >>69women);sedentary lifestyle;Other (see comment), Risk factor comments: Anxiety, Low Bone Mass     Objective:    Today's Vitals   06/19/22 0945  Weight: 163 lb (73.9 kg)  Height: 5' 3.5" (1.613 m)   Body mass index is 28.42 kg/m.     06/19/2022    9:56 AM 06/12/2021    8:49 AM 06/08/2020    8:41 AM 06/08/2019    8:27 AM 06/03/2018    9:20 AM 06/02/2017    9:19 AM 08/24/2015    8:13 AM  Advanced Directives  Does Patient Have a Medical Advance Directive? No No No No No Yes No  Type of Advance Directive      Living will   Would patient like information on creating a medical advance directive? No - Patient declined No - Patient declined No - Patient declined No - Patient declined No - Patient declined  Yes - Educational materials given    Current Medications (verified) Outpatient Encounter Medications as of 06/19/2022   Medication Sig   alendronate (FOSAMAX) 70 MG tablet Take 70 mg by mouth once a week. Take with a full glass of water on an empty stomach.   Calcium Carbonate-Vit D-Min (CALCIUM 1200 PO) Take by mouth.   cholecalciferol (VITAMIN D) 1000 units tablet Take 1,000 Units by mouth daily.   Cyanocobalamin (VITAMIN B 12) 500 MCG TABS Take by mouth.   ketorolac (ACULAR) 0.5 % ophthalmic solution Place into the right eye.   Melatonin 1 MG TABS Take 1 tablet by mouth at bedtime.   Multiple Vitamins-Minerals (CENTRUM SILVER ADULT 50+ PO) Take by mouth.   ofloxacin (OCUFLOX) 0.3 % ophthalmic solution Place into the right eye.   Omega-3 Fatty Acids (OMEGA-3 FISH OIL PO) omega-3s 720 mg-dha 300 mg-epa 360 mg-fish oil 1,200 mg capsule  Take by oral route.   prednisoLONE acetate (PRED FORTE) 1 % ophthalmic suspension INSTILL 1 DROP INTO RIGHT EYE THREE TIMES DAILY FOR 1 WEEK, THEN INSTILL 1 DROP INTO RIGHT EYE TWICE DAILY FOR 1 WEEK, THEN INSTILL 1 DROP INTO RIGHT EYE ONCE DAILY FOR 14 DAYS. START THE NEXT DAY AFTER SURGERY   rosuvastatin (CRESTOR) 10 MG tablet Take 10 mg by mouth at bedtime.   hydrocortisone 1 % lotion Apply 1 application topically 2 (two) times daily. (Patient not taking: Reported on 06/19/2022)   meloxicam (MOBIC) 15 MG tablet  (Patient not taking: Reported on 06/19/2022)   Red Yeast Rice Extract 600 MG TABS  Take by oral route. (Patient not taking: Reported on 06/19/2022)   No facility-administered encounter medications on file as of 06/19/2022.    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 2 sons. Enjoys reading.   Social Determinants of Health   Financial Resource Strain: Low Risk  (06/19/2022)   Overall Financial Resource Strain (CARDIA)    Difficulty of Paying Living Expenses: Not hard at all  Food Insecurity: No Food Insecurity (06/19/2022)   Hunger Vital Sign    Worried About Running Out of Food in the Last Year: Never true    Ran Out of Food in the Last Year: Never true  Transportation Needs: No Transportation Needs (06/19/2022)   PRAPARE - Hydrologist (Medical): No    Lack of Transportation (Non-Medical): No  Physical Activity: Insufficiently Active (06/19/2022)   Exercise Vital Sign    Days of Exercise per Week: 3 days    Minutes of Exercise per Session: 30 min  Stress: No Stress Concern Present (06/19/2022)   [place]    Feeling of Stress : Not at all  Social Connections: [place] (06/19/2022)   Social Connection and Isolation Panel [NHANES]    Frequency of Communication with Friends and Family: More than three times a week    Frequency of Social Gatherings with Friends and Family: More than three times a week    Attends Religious Services: More than 4 times per year    Active Member of [employer] or  Organizations: Yes    Attends Music therapist: More than 4 times per year    Marital Status: Married    Tobacco Counseling Counseling given: Not Answered   Clinical Intake:  Pre-visit preparation completed: Yes  Pain : No/denies pain     BMI - recorded: 28.42 Nutritional Status: BMI 25 -29 Overweight Nutritional Risks: None Diabetes: No  How often do you need to have someone help you when you read instructions, pamphlets, or other written materials from your doctor or pharmacy?: 1 - Never  Diabetic?NO  Interpreter Needed?: No  Information entered by :: mj Tal Neer, lpn   Activities  of Daily Living    06/19/2022    9:59 AM  In your present state of health, do you have any difficulty performing the following activities:  Hearing? 0  Vision? 0  Difficulty concentrating or making decisions? 0  Walking or climbing stairs? 0  Dressing or bathing? 0  Doing errands, shopping? 0  Preparing Food and eating ? N  Using the Toilet? N  In the past six months, have you accidently leaked urine? Y  Comment at times due to overactive bladder.  Do you have problems with loss of bowel control? N  Managing your Medications? N  Managing your Finances? N  Housekeeping or managing your Housekeeping? N    Patient Care Team: Dettinger, Fransisca Kaufmann, MD as PCP - General (Family Medicine) Suella Broad, MD as Consulting Physician (Orthopedic Surgery) Vania Rea, MD as Consulting Physician (Obstetrics and Gynecology)  Indicate any recent Medical Services you may have received from other than [facility] providers in the past year (date may be approximate).     Assessment:   This is a routine wellness examination for Gerardine.  Hearing/Vision screen Hearing Screening - Comments:: No hearing issues.  Vision Screening - Comments:: Glasses. Cataract removed 2 weeks and next scheduled for 06/20/22. [facility]. [employer] doing cataract surgery.   Dietary issues and  exercise activities discussed: Current Exercise Habits: Home exercise routine, Type of exercise: walking, Time (Minutes): 30, Frequency (Times/Week): 3, Weekly Exercise (Minutes/Week): 90, Intensity: Mild, Exercise limited by: psychological condition(s);orthopedic condition(s)   Goals Addressed             This Visit's Progress    DIET - EAT MORE FRUITS AND VEGETABLES   On track    DIET - INCREASE WATER INTAKE   On track    You should drink at least 64 oz of water daily     Exercise 3x per week (30 min per time)   Not on track    Increase walking to 20-30 minutes per day at least 3 times per week     Patient Stated   Not on track    06/08/2020 AWV Goal: Exercise for General Health  Patient will verbalize understanding of the benefits of increased physical activity: Exercising regularly is important. It will improve your overall fitness, flexibility, and endurance. Regular exercise also will improve your overall health. It can help you control your weight, reduce stress, and improve your bone density. Over the next year, patient will increase physical activity as tolerated with a goal of at least 150 minutes of moderate physical activity per week.  You can tell that you are exercising at a moderate intensity if your heart starts beating faster and you start breathing faster but can still hold a conversation. Moderate-intensity exercise ideas include: Walking 1 mile (1.6 km) in about 15 minutes Biking Hiking Golfing Dancing Water aerobics Patient will verbalize understanding of everyday activities that increase physical activity by providing examples like the following: Yard work, such as: Sales promotion account executive Gardening Washing windows or floors Patient will be able to explain general safety guidelines for exercising:  Before you start a new exercise program, talk with your health care provider. Do  not exercise so much that you hurt yourself, feel dizzy, or get very short of breath. Wear comfortable clothes and wear shoes with good support. Drink plenty of water while you exercise to prevent dehydration or heat stroke. Work out until  your breathing and your heartbeat get faster.        Depression Screen    06/19/2022    9:53 AM 11/05/2021   10:39 AM 06/12/2021    8:50 AM 06/08/2020    8:42 AM 09/01/2019    9:43 AM 06/08/2019    8:28 AM 02/03/2019    9:05 AM  PHQ 2/9 Scores  PHQ - 2 Score 0 0 0 0 0 0 0    Fall Risk    06/19/2022    9:57 AM 11/05/2021   10:39 AM 06/12/2021    8:50 AM 06/08/2020    8:42 AM 09/01/2019    9:43 AM  Fall Risk   Falls in the past year? 0 0 0 0 0  Number falls in past yr: 0      Injury with Fall? 0      Risk for fall due to : No Fall Risks      Follow up Falls prevention discussed        [place]:  Any stairs in or around the home? Yes  If so, are there any without handrails? No  Home free of loose throw rugs in walkways, pet beds, electrical cords, etc? Yes  Adequate lighting in your home to reduce risk of falls? Yes   ASSISTIVE DEVICES UTILIZED TO PREVENT FALLS:  Life alert? No  Use of a cane, walker or w/c? No  Grab bars in the bathroom? No  Shower chair or bench in shower? No  Elevated toilet seat or a handicapped toilet? Yes   TIMED UP AND GO:  Was the test performed? No .  Phone visit.   Cognitive Function:    06/03/2018    9:29 AM 06/02/2017   11:13 AM 08/24/2015    8:16 AM  MMSE - Mini Mental State Exam  Orientation to time '4 5 5  '$ Orientation to Place '5 5 5  '$ Registration '3 3 3  '$ Attention/ Calculation '5 5 5  '$ Recall '3 3 3  '$ Language- name 2 objects '2 2 2  '$ Language- repeat '1 1 1  '$ Language- follow 3 step command '3 3 3  '$ Language- read & follow direction '1 1 1  '$ Write a sentence '1 1 1  '$ Copy design '1 1 1  '$ Total score '29 30 30        '$ 06/19/2022   10:00 AM 06/12/2021    8:52 AM 06/08/2020     8:44 AM 06/08/2019    8:29 AM  6CIT Screen  What Year? 0 points 0 points 0 points 0 points  What month? 0 points 0 points 0 points 0 points  What time? 0 points 0 points 0 points 0 points  Count back from 20 0 points 0 points 0 points 0 points  Months in reverse 0 points 0 points 0 points 0 points  Repeat phrase 0 points 0 points 0 points 2 points  Total Score 0 points 0 points 0 points 2 points    Immunizations Immunization History  Administered Date(s) Administered   Fluad Quad(high Dose 65+) 10/07/2019, 09/27/2020, 10/05/2021   Influenza, High Dose Seasonal PF 10/21/2014, 10/28/2015, 10/16/2016, 10/06/2017, 10/06/2018   Influenza,inj,Quad PF,6+ Mos 10/14/2013   Influenza-Unspecified 10/09/2014, 10/09/2016   Pneumococcal Conjugate-13 08/24/2015   Pneumococcal Polysaccharide-23 06/02/2017   Tdap 06/03/2018   Unspecified SARS-COV-2 Vaccination 01/01/2020, 01/29/2020   Zoster Recombinat (Shingrix) 08/13/2018, 02/03/2019   Zoster, Live 04/14/2012    TDAP status: Up to date  Flu Vaccine  status: Up to date  Pneumococcal vaccine status: Up to date  Covid-19 vaccine status: Completed vaccines  Qualifies for Shingles Vaccine? Yes   Zostavax completed Yes   Shingrix Completed?: Yes  Screening Tests Health Maintenance  Topic Date Due   MAMMOGRAM  07/03/2022 (Originally 06/18/2022)   DEXA SCAN  08/08/2022 (Originally 09/19/2018)   COVID-19 Vaccine (3 - Mixed Product series) 10/08/2022 (Originally 03/25/2020)   Hepatitis C Screening  10/08/2022 (Originally 01/29/1964)   INFLUENZA VACCINE  07/09/2022   TETANUS/TDAP  06/03/2028   Pneumonia Vaccine 24+ Years old  Completed   Zoster Vaccines- Shingrix  Completed   HPV VACCINES  Aged Out   COLONOSCOPY (Pts 45-16yr Insurance coverage will need to be confirmed)  Discontinued    Health Maintenance  There are no preventive care reminders to display for this patient.   Colorectal cancer screening: Type of screening: Colonoscopy.  Completed 06/09/2019. Repeat every 3 years  Mammogram status: Completed 06/18/2021. Repeat every year  Bone Density status: Completed 09/19/2016. Results reflect: Bone density results: OSTEOPENIA. Repeat every 2 years.  Lung Cancer Screening: (Low Dose CT Chest recommended if Age 78-80years, 30 pack-year currently smoking OR have quit w/in 15years.) does not qualify.   Additional Screening:  Hepatitis C Screening: does qualify; Completed DUE  Vision Screening: Recommended annual ophthalmology exams for early detection of glaucoma and other disorders of the eye. Is the patient up to date with their annual eye exam?  Yes  Who is the provider or what is the name of the office in which the patient attends annual eye exams? [facility] If pt is not established with a provider, would they like to be referred to a provider to establish care? No .   Dental Screening: Recommended annual dental exams for proper oral hygiene  Community Resource Referral / Chronic Care Management: CRR required this visit?  No   CCM required this visit?  No      Plan:     I have personally reviewed and noted the following in the patient's chart:   Medical and social history Use of alcohol, tobacco or illicit drugs  Current medications and supplements including opioid prescriptions.  Functional ability and status Nutritional status Physical activity Advanced directives List of other physicians Hospitalizations, surgeries, and ER visits in previous 12 months Vitals Screenings to include cognitive, depression, and falls Referrals and appointments  In addition, I have reviewed and discussed with patient certain preventive protocols, quality metrics, and best practice recommendations. A written personalized care plan for preventive services as well as general preventive health recommendations were provided to patient.     MChriss Driver LPN   [id number]  Nurse Notes: Pt states she has  mammogram scheduled for 07/02/2022 with GYN. Pt also states she has had a recent DEXA with GYN and will have them send report to our office.

## 2022-06-19 NOTE — Patient Instructions (Signed)
Marissa Gomez , Thank you for taking time to come for your Medicare Wellness Visit. I appreciate your ongoing commitment to your health goals. Please review the following plan we discussed and let me know if I can assist you in the future.   Screening recommendations/referrals: Colonoscopy: Done 06/09/2019. Repeat as directed by GI. Mammogram: Scheduled for 07/02/2022. Bone Density: Discuss with GYN when to schedule.  Recommended yearly ophthalmology/optometry visit for glaucoma screening and checkup Recommended yearly dental visit for hygiene and checkup  Vaccinations: Influenza vaccine: Done 10/05/2021 Repeat annually  Pneumococcal vaccine: Done 08/24/2015, 06/02/2017. Tdap vaccine: Done 06/03/2018 Repeat in 10 years  Shingles vaccine: Done 02/03/2019, 08/13/2018 and 04/14/2012.   Covid-19:Done 2/202021, 01/01/2020.  Advanced directives: Advance directive discussed with you today. Even though you declined this today, please call our office should you change your mind, and we can give you the proper paperwork for you to fill out. Available at [facility].  Conditions/risks identified: Aim for 30 minutes of exercise or brisk walking, 6-8 glasses of water, and 5 servings of fruits and vegetables each day.  Next appointment: Follow up in one year for your annual wellness visit 2024.   Preventive Care 78 Years and Older, Female Preventive care refers to lifestyle choices and visits with your health care provider that can promote health and wellness. What does preventive care include? A yearly physical exam. This is also called an annual well check. Dental exams once or twice a year. Routine eye exams. Ask your health care provider how often you should have your eyes checked. Personal lifestyle choices, including: Daily care of your teeth and gums. Regular physical activity. Eating a healthy diet. Avoiding tobacco and drug use. Limiting alcohol use. Practicing safe sex. Taking low-dose aspirin every  day. Taking vitamin and mineral supplements as recommended by your health care provider. What happens during an annual well check? The services and screenings done by your health care provider during your annual well check will depend on your age, overall health, lifestyle risk factors, and family history of disease. Counseling  Your health care provider may ask you questions about your: Alcohol use. Tobacco use. Drug use. Emotional well-being. Home and relationship well-being. Sexual activity. Eating habits. History of falls. Memory and ability to understand (cognition). Work and work Statistician. Reproductive health. Screening  You may have the following tests or measurements: Height, weight, and BMI. Blood pressure. Lipid and cholesterol levels. These may be checked every 5 years, or more frequently if you are over 32 years old. Skin check. Lung cancer screening. You may have this screening every year starting at age 78 if you have a 30-pack-year history of smoking and currently smoke or have quit within the past 15 years. Fecal occult blood test (FOBT) of the stool. You may have this test every year starting at age 78 Flexible sigmoidoscopy or colonoscopy. You may have a sigmoidoscopy every 5 years or a colonoscopy every 10 years starting at age 78 Hepatitis C blood test. Hepatitis B blood test. Sexually transmitted disease (STD) testing. Diabetes screening. This is done by checking your blood sugar (glucose) after you have not eaten for a while (fasting). You may have this done every 1-3 years. Bone density scan. This is done to screen for osteoporosis. You may have this done starting at age 78 Mammogram. This may be done every 1-2 years. Talk to your health care provider about how often you should have regular mammograms. Talk with your health care provider about your test results, treatment  options, and if necessary, the need for more tests. Vaccines  Your health care  provider may recommend certain vaccines, such as: Influenza vaccine. This is recommended every year. Tetanus, diphtheria, and acellular pertussis (Tdap, Td) vaccine. You may need a Td booster every 10 years. Zoster vaccine. You may need this after age 78 Pneumococcal 13-valent conjugate (PCV13) vaccine. One dose is recommended after age 78 Pneumococcal polysaccharide (PPSV23) vaccine. One dose is recommended after age 78 Talk to your health care provider about which screenings and vaccines you need and how often you need them. This information is not intended to replace advice given to you by your health care provider. Make sure you discuss any questions you have with your health care provider. Document Released: 12/22/2015 Document Revised: 08/14/2016 Document Reviewed: 09/26/2015 Elsevier Interactive Patient Education  2017 [place] Prevention in the Home Falls can cause injuries. They can happen to people of all ages. There are many things you can do to make your home safe and to help prevent falls. What can I do on the outside of my home? Regularly fix the edges of walkways and driveways and fix any cracks. Remove anything that might make you trip as you walk through a door, such as a raised step or threshold. Trim any bushes or trees on the path to your home. Use bright outdoor lighting. Clear any walking paths of anything that might make someone trip, such as rocks or tools. Regularly check to see if handrails are loose or broken. Make sure that both sides of any steps have handrails. Any raised decks and porches should have guardrails on the edges. Have any leaves, snow, or ice cleared regularly. Use sand or salt on walking paths during winter. Clean up any spills in your garage right away. This includes oil or grease spills. What can I do in the bathroom? Use night lights. Install grab bars by the toilet and in the tub and shower. Do not use towel bars as grab  bars. Use non-skid mats or decals in the tub or shower. If you need to sit down in the shower, use a plastic, non-slip stool. Keep the floor dry. Clean up any water that spills on the floor as soon as it happens. Remove soap buildup in the tub or shower regularly. Attach bath mats securely with double-sided non-slip rug tape. Do not have throw rugs and other things on the floor that can make you trip. What can I do in the bedroom? Use night lights. Make sure that you have a light by your bed that is easy to reach. Do not use any sheets or blankets that are too big for your bed. They should not hang down onto the floor. Have a firm chair that has side arms. You can use this for support while you get dressed. Do not have throw rugs and other things on the floor that can make you trip. What can I do in the kitchen? Clean up any spills right away. Avoid walking on wet floors. Keep items that you use a lot in easy-to-reach places. If you need to reach something above you, use a strong step stool that has a grab bar. Keep electrical cords out of the way. Do not use floor polish or wax that makes floors slippery. If you must use wax, use non-skid floor wax. Do not have throw rugs and other things on the floor that can make you trip. What can I do with my stairs? Do not  leave any items on the stairs. Make sure that there are handrails on both sides of the stairs and use them. Fix handrails that are broken or loose. Make sure that handrails are as long as the stairways. Check any carpeting to make sure that it is firmly attached to the stairs. Fix any carpet that is loose or worn. Avoid having throw rugs at the top or bottom of the stairs. If you do have throw rugs, attach them to the floor with carpet tape. Make sure that you have a light switch at the top of the stairs and the bottom of the stairs. If you do not have them, ask someone to add them for you. What else can I do to help prevent  falls? Wear shoes that: Do not have high heels. Have rubber bottoms. Are comfortable and fit you well. Are closed at the toe. Do not wear sandals. If you use a stepladder: Make sure that it is fully opened. Do not climb a closed stepladder. Make sure that both sides of the stepladder are locked into place. Ask someone to hold it for you, if possible. Clearly mark and make sure that you can see: Any grab bars or handrails. First and last steps. Where the edge of each step is. Use tools that help you move around (mobility aids) if they are needed. These include: Canes. Walkers. Scooters. Crutches. Turn on the lights when you go into a dark area. Replace any light bulbs as soon as they burn out. Set up your furniture so you have a clear path. Avoid moving your furniture around. If any of your floors are uneven, fix them. If there are any pets around you, be aware of where they are. Review your medicines with your doctor. Some medicines can make you feel dizzy. This can increase your chance of falling. Ask your doctor what other things that you can do to help prevent falls. This information is not intended to replace advice given to you by your health care provider. Make sure you discuss any questions you have with your health care provider. Document Released: 09/21/2009 Document Revised: 05/02/2016 Document Reviewed: 12/30/2014 Elsevier Interactive Patient Education  2017 [employer].

## 2022-06-20 DIAGNOSIS — H25812 Combined forms of age-related cataract, left eye: Secondary | ICD-10-CM | POA: Diagnosis not present

## 2022-06-20 DIAGNOSIS — H269 Unspecified cataract: Secondary | ICD-10-CM | POA: Diagnosis not present

## 2022-07-02 DIAGNOSIS — Z0000 Encounter for general adult medical examination without abnormal findings: Secondary | ICD-10-CM | POA: Diagnosis not present

## 2022-07-02 DIAGNOSIS — Z1231 Encounter for screening mammogram for malignant neoplasm of breast: Secondary | ICD-10-CM | POA: Diagnosis not present

## 2022-07-02 DIAGNOSIS — E7800 Pure hypercholesterolemia, unspecified: Secondary | ICD-10-CM | POA: Diagnosis not present

## 2022-07-08 DIAGNOSIS — M8589 Other specified disorders of bone density and structure, multiple sites: Secondary | ICD-10-CM | POA: Diagnosis not present

## 2022-07-08 DIAGNOSIS — Z780 Asymptomatic menopausal state: Secondary | ICD-10-CM | POA: Diagnosis not present

## 2022-07-09 DIAGNOSIS — Z1211 Encounter for screening for malignant neoplasm of colon: Secondary | ICD-10-CM | POA: Diagnosis not present

## 2022-07-09 LAB — COLOGUARD

## 2022-07-19 LAB — COLOGUARD: COLOGUARD: NEGATIVE

## 2022-08-21 ENCOUNTER — Ambulatory Visit (INDEPENDENT_AMBULATORY_CARE_PROVIDER_SITE_OTHER): Payer: Medicare Other | Admitting: Family Medicine

## 2022-08-21 ENCOUNTER — Encounter: Payer: Self-pay | Admitting: Family Medicine

## 2022-08-21 VITALS — BP 107/67 | HR 86 | Temp 98.0°F | Ht 63.5 in | Wt 167.0 lb

## 2022-08-21 DIAGNOSIS — N3001 Acute cystitis with hematuria: Secondary | ICD-10-CM

## 2022-08-21 DIAGNOSIS — R300 Dysuria: Secondary | ICD-10-CM

## 2022-08-21 DIAGNOSIS — Z8742 Personal history of other diseases of the female genital tract: Secondary | ICD-10-CM

## 2022-08-21 LAB — URINALYSIS, COMPLETE
Bilirubin, UA: NEGATIVE
Glucose, UA: NEGATIVE
Ketones, UA: NEGATIVE
Nitrite, UA: NEGATIVE
Specific Gravity, UA: 1.025 (ref 1.005–1.030)
Urobilinogen, Ur: 0.2 mg/dL (ref 0.2–1.0)
pH, UA: 5.5 (ref 5.0–7.5)

## 2022-08-21 LAB — MICROSCOPIC EXAMINATION
RBC, Urine: NONE SEEN /hpf (ref 0–2)
Renal Epithel, UA: NONE SEEN /hpf
WBC, UA: >30 /hpf — AB (ref 0–5)

## 2022-08-21 MED ORDER — SULFAMETHOXAZOLE-TRIMETHOPRIM 800-160 MG PO TABS: 1.0000 | ORAL_TABLET | Freq: Two times a day (BID) | ORAL | 0 refills | Status: AC

## 2022-08-21 MED ORDER — FLUCONAZOLE 150 MG PO TABS: 150.0000 mg | ORAL_TABLET | Freq: Once | ORAL | 0 refills | Status: AC

## 2022-08-21 NOTE — Progress Notes (Signed)
Subjective:  Patient ID: Marissa Gomez, female    DOB: 01-Dec-1946, 76 y.o.   MRN: [id number]  Patient Care Team: Dettinger, Fransisca Kaufmann, MD as PCP - General (Family Medicine) Suella Broad, MD as Consulting Physician (Orthopedic Surgery) Vania Rea, MD as Consulting Physician (Obstetrics and Gynecology)   Chief Complaint:  Dysuria   HPI: Marissa Gomez is a 76 y.o. female presenting on 08/21/2022 for Dysuria   Pt presents today with complaints of intermittent dysuria for the last 4 weeks. Gradually worsening.  Dysuria  This is a new problem. The current episode started 1 to 4 weeks ago. The problem occurs intermittently. The problem has been gradually worsening. The quality of the pain is described as burning. The pain is mild. There has been no fever. She is Not sexually active. There is No history of pyelonephritis. Associated symptoms include frequency and urgency. Pertinent negatives include no chills, discharge, flank pain, hematuria, hesitancy, nausea, possible pregnancy, sweats or vomiting. She has tried increased fluids for the symptoms. The treatment provided no relief.     Relevant past medical, surgical, family, and social history reviewed and updated as indicated.  Allergies and medications reviewed and updated. Data reviewed: Chart in Epic.   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 2 sons. Enjoys reading.   Social Determinants of Health   Financial Resource Strain: Low Risk  (06/19/2022)   Overall Financial Resource Strain (CARDIA)    Difficulty of Paying Living Expenses: Not hard at all  Food Insecurity: No Food Insecurity (06/19/2022)   Hunger Vital Sign    Worried About Running Out of Food in the Last Year: Never true    Ran Out of Food in the Last Year: Never true  Transportation Needs: No Transportation Needs (06/19/2022)   PRAPARE - Hydrologist (Medical): No    Lack of Transportation (Non-Medical): No  Physical Activity: Insufficiently Active (06/19/2022)   Exercise Vital Sign    Days of Exercise per Week: 3 days    Minutes of Exercise per Session: 30 min  Stress: No Stress Concern Present (06/19/2022)   [place]    Feeling of Stress : Not at all  Social Connections: [place] (06/19/2022)   Social Connection and Isolation Panel [NHANES]    Frequency of  Communication with Friends and Family: More than three times a week    Frequency of Social Gatherings with Friends and Family: More than three times a week    Attends Religious Services: More than 4 times per year    Active Member of [employer] or Organizations: Yes    Attends Music therapist: More than 4 times per year    Marital Status: Married  Human resources officer Violence: Not At Risk (06/19/2022)   Humiliation, Afraid, Rape, and Kick questionnaire    Fear of Current or Ex-Partner: No    Emotionally Abused: No    Physically Abused: No    Sexually Abused: No     Outpatient Encounter Medications as of 08/21/2022  Medication Sig   Calcium Carbonate-Vit D-Min (CALCIUM 1200 PO) Take by mouth.   cholecalciferol (VITAMIN D) 1000 units tablet Take 1,000 Units by mouth daily.   Cyanocobalamin (VITAMIN B 12) 500 MCG TABS Take by mouth.   fluconazole (DIFLUCAN) 150 MG tablet Take 1 tablet (150 mg total) by mouth once for 1 dose.   ketorolac (ACULAR) 0.5 % ophthalmic solution Place into the right eye.   Melatonin 1 MG TABS Take 1 tablet by mouth at bedtime.   Multiple Vitamins-Minerals (CENTRUM SILVER ADULT 50+ PO) Take by mouth.   ofloxacin (OCUFLOX) 0.3 % ophthalmic solution Place into the right eye.   Omega-3 Fatty Acids (OMEGA-3 FISH OIL PO) omega-3s 720 mg-dha 300 mg-epa 360 mg-fish oil 1,200 mg capsule  Take by oral route.   rosuvastatin (CRESTOR) 10 MG tablet Take 10 mg by mouth at bedtime.   sulfamethoxazole-trimethoprim (BACTRIM DS) 800-160 MG tablet Take 1 tablet by mouth 2 (two) times daily for 5 days.   [DISCONTINUED] alendronate (FOSAMAX) 70 MG tablet Take 70 mg by mouth once a week. Take with a full glass of water on an empty stomach.   [DISCONTINUED] hydrocortisone 1 % lotion Apply 1 application topically 2 (two) times daily.   [DISCONTINUED] meloxicam (MOBIC) 15 MG tablet    [DISCONTINUED] prednisoLONE acetate (PRED FORTE) 1 % ophthalmic suspension INSTILL 1 DROP INTO RIGHT EYE THREE TIMES DAILY FOR 1 WEEK, THEN INSTILL 1 DROP INTO RIGHT EYE TWICE DAILY FOR 1 WEEK, THEN INSTILL 1 DROP INTO RIGHT EYE ONCE DAILY FOR 14 DAYS. START THE NEXT DAY AFTER SURGERY   [DISCONTINUED] Red Yeast Rice Extract 600 MG TABS    No facility-administered encounter medications on file as of 08/21/2022.    Allergies  Allergen Reactions   Penicillins Rash    Review of Systems  Constitutional:  Negative for activity change, appetite change, chills, diaphoresis, fatigue, fever and unexpected weight change.  Respiratory:  Negative for cough and shortness of  breath.   Cardiovascular:  Negative for chest pain, palpitations and leg swelling.  Gastrointestinal:  Negative for abdominal distention, abdominal pain, anal bleeding, blood in stool, constipation, diarrhea, nausea, rectal pain and vomiting.  Genitourinary:  Positive for dysuria, frequency and urgency. Negative for decreased urine volume, difficulty urinating, enuresis, flank pain, hematuria, hesitancy, pelvic pain, vaginal bleeding, vaginal discharge and vaginal pain.  Musculoskeletal:  Negative for back pain.  Neurological:  Negative for weakness.  Psychiatric/Behavioral:  Negative for confusion.   All other systems reviewed and are negative.       Objective:  BP 107/67   Pulse 86   Temp 98 F (36.7 C)   Ht 5' 3.5" (1.613 m)   Wt 167 lb (75.8 kg)   SpO2 95%   BMI 29.12 kg/m  Wt Readings from Last 3 Encounters:  08/21/22 167 lb (75.8 kg)  06/19/22 163 lb (73.9 kg)  12/14/21 163 lb (73.9 kg)    Physical Exam Vitals and nursing note reviewed.  Constitutional:      General: She is not in acute distress.    Appearance: Normal appearance. She is well-developed and well-groomed. She is not ill-appearing, toxic-appearing or diaphoretic.  HENT:     Head: Normocephalic and atraumatic.     Jaw: There is normal jaw occlusion.     Right Ear: Hearing normal.     Left Ear: Hearing normal.     Nose: Nose normal.     Mouth/Throat:     Lips: Pink.     Mouth: Mucous membranes are moist.     Pharynx: Oropharynx is clear. Uvula midline.  Eyes:     General: Lids are normal.     Extraocular Movements: Extraocular movements intact.     Conjunctiva/sclera: Conjunctivae normal.     Pupils: Pupils are equal, round, and reactive to light.  Neck:     Thyroid: No thyroid mass, thyromegaly or thyroid tenderness.     Vascular: No carotid bruit or JVD.     Trachea: Trachea and phonation normal.  Cardiovascular:     Rate and Rhythm: Normal rate and regular rhythm.     Chest Wall: PMI is not  displaced.     Pulses: Normal pulses.     Heart sounds: Normal heart sounds. No murmur heard.    No friction rub. No gallop.  Pulmonary:     Effort: Pulmonary effort is normal. No respiratory distress.     Breath sounds: Normal breath sounds. No wheezing.  Abdominal:     General: Bowel sounds are normal. There is no distension or abdominal bruit.     Palpations: Abdomen is soft. There is no hepatomegaly or splenomegaly.     Tenderness: There is no abdominal tenderness. There is no right CVA tenderness or left CVA tenderness.     Hernia: No hernia is present.  Musculoskeletal:     Cervical back: Normal range of motion and neck supple.  Lymphadenopathy:     Cervical: No cervical adenopathy.  Skin:    General: Skin is warm and dry.     Capillary Refill: Capillary refill takes less than 2 seconds.     Coloration: Skin is not cyanotic, jaundiced or pale.     Findings: No rash.  Neurological:     General: No focal deficit present.     Mental Status: She is alert and oriented to person, place, and time.     Sensory: Sensation is intact.     Motor: Motor function is intact.     Coordination: Coordination is intact.     Gait: Gait is intact.     Deep Tendon Reflexes: Reflexes are normal and symmetric.  Psychiatric:        Attention and Perception: Attention and perception normal.        Mood and Affect: Mood and affect normal.        Speech: Speech normal.        Behavior: Behavior normal. Behavior is cooperative.        Thought Content: Thought content normal.        Cognition and Memory: Cognition and memory normal.        Judgment: Judgment normal.     Results for orders placed or performed in visit on 09/01/19  Urine Culture   Specimen: Urine  UR  Result Value Ref Range   Urine Culture, Routine Final report (A)    Organism ID, Bacteria Escherichia coli (A)    Antimicrobial Susceptibility Comment   Microscopic Examination   URINE  Result Value Ref Range   WBC, UA >30 (A)  0 - 5 /hpf   RBC, Urine 3-10 (A) 0 - 2 /hpf   Epithelial Cells (non renal) 0-10 0 - 10 /hpf   Renal Epithel, UA 0-10 (A) None seen /hpf   Bacteria, UA Many (A) None seen/Few  Urinalysis, Complete  Result Value Ref Range   Specific Gravity, UA 1.010 1.005 - 1.030   pH, UA 7.0 5.0 - 7.5   Color, UA Yellow Yellow   Appearance Ur Clear Clear   Leukocytes,UA 3+ (A) Negative   Protein,UA Negative Negative/Trace   Glucose, UA Negative Negative   Ketones, UA Negative Negative   RBC, UA 3+ (A) Negative   Bilirubin, UA Negative Negative   Urobilinogen, Ur 0.2 0.2 - 1.0 mg/dL   Nitrite, UA Negative Negative   Microscopic Examination See below:        Pertinent labs & imaging results that were available during my care of the patient were reviewed by me and considered in my medical decision making.  Assessment & Plan:  Tifany was seen today for dysuria.  Diagnoses and all orders for this visit:  Dysuria Urinalysis in office with 3+ leukocytes and trace blood. Culture pending.  -     Urine Culture -     Urinalysis, Complete  Acute cystitis with hematuria Urinalysis as noted. Prior culture reviewed and antibiotic selection based on results and pt allergies. Symptomatic care discussed in detail. Report new, worsening, or persistent symptoms. Will change regimen if warranted by culture results.  -     sulfamethoxazole-trimethoprim (BACTRIM DS) 800-160 MG tablet; Take 1 tablet by mouth 2 (two) times daily for 5 days.  History of vaginitis -     fluconazole (DIFLUCAN) 150 MG tablet; Take 1 tablet (150 mg total) by mouth once for 1 dose.     Continue all other maintenance medications.  Follow up plan: Return if symptoms worsen or fail to improve.   Continue healthy lifestyle choices, including diet (rich in fruits, vegetables, and lean proteins, and low in salt and simple carbohydrates) and exercise (at least 30 minutes of moderate physical activity daily).  Educational handout given  for UTI  The above assessment and management plan was discussed with the patient. The patient verbalized understanding of and has agreed to the management plan. Patient is aware to call the clinic if they develop any new symptoms or if symptoms persist or worsen. Patient is aware when to return to the clinic for a follow-up visit. Patient educated on when it is appropriate to go to the emergency department.   Monia Pouch, FNP-C [facility] [phone]

## 2022-08-25 LAB — URINE CULTURE

## 2022-08-29 DIAGNOSIS — M25551 Pain in right hip: Secondary | ICD-10-CM | POA: Diagnosis not present

## 2022-08-29 DIAGNOSIS — M545 Low back pain, unspecified: Secondary | ICD-10-CM | POA: Diagnosis not present

## 2022-10-02 ENCOUNTER — Ambulatory Visit (INDEPENDENT_AMBULATORY_CARE_PROVIDER_SITE_OTHER): Payer: Medicare Other

## 2022-10-02 DIAGNOSIS — Z23 Encounter for immunization: Secondary | ICD-10-CM | POA: Diagnosis not present

## 2022-11-28 DIAGNOSIS — M5416 Radiculopathy, lumbar region: Secondary | ICD-10-CM | POA: Diagnosis not present

## 2022-12-17 DIAGNOSIS — M5416 Radiculopathy, lumbar region: Secondary | ICD-10-CM | POA: Diagnosis not present

## 2022-12-31 DIAGNOSIS — M5416 Radiculopathy, lumbar region: Secondary | ICD-10-CM | POA: Diagnosis not present

## 2023-03-13 DIAGNOSIS — M5416 Radiculopathy, lumbar region: Secondary | ICD-10-CM | POA: Diagnosis not present

## 2023-06-04 ENCOUNTER — Ambulatory Visit (INDEPENDENT_AMBULATORY_CARE_PROVIDER_SITE_OTHER): Payer: Medicare Other | Admitting: Family Medicine

## 2023-06-04 ENCOUNTER — Encounter: Payer: Self-pay | Admitting: Family Medicine

## 2023-06-04 VITALS — BP 111/71 | HR 88 | Temp 97.9°F | Resp 20 | Ht 63.0 in | Wt 165.0 lb

## 2023-06-04 DIAGNOSIS — N3000 Acute cystitis without hematuria: Secondary | ICD-10-CM

## 2023-06-04 DIAGNOSIS — R350 Frequency of micturition: Secondary | ICD-10-CM | POA: Diagnosis not present

## 2023-06-04 DIAGNOSIS — B379 Candidiasis, unspecified: Secondary | ICD-10-CM

## 2023-06-04 LAB — URINALYSIS, COMPLETE
Bilirubin, UA: NEGATIVE
Glucose, UA: NEGATIVE
Ketones, UA: NEGATIVE
Nitrite, UA: POSITIVE — AB
Protein,UA: NEGATIVE
Specific Gravity, UA: 1.015 (ref 1.005–1.030)
Urobilinogen, Ur: 0.2 mg/dL (ref 0.2–1.0)
pH, UA: 6 (ref 5.0–7.5)

## 2023-06-04 LAB — MICROSCOPIC EXAMINATION
RBC, Urine: NONE SEEN /hpf (ref 0–2)
Renal Epithel, UA: NONE SEEN /hpf
WBC, UA: >30 /hpf — AB (ref 0–5)

## 2023-06-04 MED ORDER — FLUCONAZOLE 150 MG PO TABS: ORAL_TABLET | ORAL | 0 refills | Status: DC

## 2023-06-04 MED ORDER — SULFAMETHOXAZOLE-TRIMETHOPRIM 800-160 MG PO TABS: 1.0000 | ORAL_TABLET | Freq: Two times a day (BID) | ORAL | 0 refills | Status: AC

## 2023-06-04 NOTE — Patient Instructions (Signed)
Urinary Tract Infection, Adult  A urinary tract infection (UTI) is an infection of any part of the urinary tract. The urinary tract includes the kidneys, ureters, bladder, and urethra. These organs make, store, and get rid of urine in the body. An upper UTI affects the ureters and kidneys. A lower UTI affects the bladder and urethra. What are the causes? Most urinary tract infections are caused by bacteria in your genital area around your urethra, where urine leaves your body. These bacteria grow and cause inflammation of your urinary tract. What increases the risk? You are more likely to develop this condition if: You have a urinary catheter that stays in place. You are not able to control when you urinate or have a bowel movement (incontinence). You are female and you: Use a spermicide or diaphragm for birth control. Have low estrogen levels. Are pregnant. You have certain genes that increase your risk. You are sexually active. You take antibiotic medicines. You have a condition that causes your flow of urine to slow down, such as: An enlarged prostate, if you are female. Blockage in your urethra. A kidney stone. A nerve condition that affects your bladder control (neurogenic bladder). Not getting enough to drink, or not urinating often. You have certain medical conditions, such as: Diabetes. A weak disease-fighting system (immunesystem). Sickle cell disease. Gout. Spinal cord injury. What are the signs or symptoms? Symptoms of this condition include: Needing to urinate right away (urgency). Frequent urination. This may include small amounts of urine each time you urinate. Pain or burning with urination. Blood in the urine. Urine that smells bad or unusual. Trouble urinating. Cloudy urine. Vaginal discharge, if you are female. Pain in the abdomen or the lower back. You may also have: Vomiting or a decreased appetite. Confusion. Irritability or tiredness. A fever or  chills. Diarrhea. The first symptom in older adults may be confusion. In some cases, they may not have any symptoms until the infection has worsened. How is this diagnosed? This condition is diagnosed based on your medical history and a physical exam. You may also have other tests, including: Urine tests. Blood tests. Tests for STIs (sexually transmitted infections). If you have had more than one UTI, a cystoscopy or imaging studies may be done to determine the cause of the infections. How is this treated? Treatment for this condition includes: Antibiotic medicine. Over-the-counter medicines to treat discomfort. Drinking enough water to stay hydrated. If you have frequent infections or have other conditions such as a kidney stone, you may need to see a health care provider who specializes in the urinary tract (urologist). In rare cases, urinary tract infections can cause sepsis. Sepsis is a life-threatening condition that occurs when the body responds to an infection. Sepsis is treated in the hospital with IV antibiotics, fluids, and other medicines. Follow these instructions at home:  Medicines Take over-the-counter and prescription medicines only as told by your health care provider. If you were prescribed an antibiotic medicine, take it as told by your health care provider. Do not stop using the antibiotic even if you start to feel better. General instructions Make sure you: Empty your bladder often and completely. Do not hold urine for long periods of time. Empty your bladder after sex. Wipe from front to back after urinating or having a bowel movement if you are female. Use each tissue only one time when you wipe. Drink enough fluid to keep your urine pale yellow. Keep all follow-up visits. This is important. Contact a health   care provider if: Your symptoms do not get better after 1-2 days. Your symptoms go away and then return. Get help right away if: You have severe pain in  your back or your lower abdomen. You have a fever or chills. You have nausea or vomiting. Summary A urinary tract infection (UTI) is an infection of any part of the urinary tract, which includes the kidneys, ureters, bladder, and urethra. Most urinary tract infections are caused by bacteria in your genital area. Treatment for this condition often includes antibiotic medicines. If you were prescribed an antibiotic medicine, take it as told by your health care provider. Do not stop using the antibiotic even if you start to feel better. Keep all follow-up visits. This is important. This information is not intended to replace advice given to you by your health care provider. Make sure you discuss any questions you have with your health care provider. Document Revised: 07/02/2020 Document Reviewed: 07/07/2020 Elsevier Patient Education  2024 Elsevier Inc.  

## 2023-06-04 NOTE — Progress Notes (Signed)
Acute Office Visit  Subjective:     Patient ID: Marissa Gomez, female    DOB: 1946-09-13, 77 y.o.   MRN: [id number]  Chief Complaint  Patient presents with   Vaginal irritation   Urinary Frequency    Urinary Frequency  This is a recurrent problem. The current episode started 1 to 4 weeks ago. The problem occurs intermittently. The problem has been waxing and waning. The quality of the pain is described as burning. The pain is mild. There has been no fever. Associated symptoms include frequency and urgency. Pertinent negatives include no chills, discharge, flank pain, hematuria, hesitancy, nausea, possible pregnancy, sweats or vomiting. Associated symptoms comments: Vulvar irritation. Denies itching. She has tried increased fluids (monistat OTC- helps then symptoms return) for the symptoms. Her past medical history is significant for recurrent UTIs.    Review of Systems  Constitutional:  Negative for chills.  Gastrointestinal:  Negative for nausea and vomiting.  Genitourinary:  Positive for frequency and urgency. Negative for flank pain, hematuria and hesitancy.        Objective:    BP 111/71   Pulse 88   Temp 97.9 F (36.6 C) (Temporal)   Resp 20   Ht 5\' 3"  (1.6 m)   Wt 165 lb (74.8 kg)   SpO2 93%   BMI 29.23 kg/m    Physical Exam Vitals and nursing note reviewed.  Constitutional:      General: She is not in acute distress.    Appearance: Normal appearance. She is not ill-appearing, toxic-appearing or diaphoretic.  Cardiovascular:     Rate and Rhythm: Normal rate and regular rhythm.     Heart sounds: Normal heart sounds. No murmur heard. Pulmonary:     Effort: Pulmonary effort is normal.     Breath sounds: Normal breath sounds.  Abdominal:     General: Bowel sounds are normal. There is no distension.     Palpations: Abdomen is soft.     Tenderness: There is no abdominal tenderness. There is no right CVA tenderness, left CVA tenderness, guarding or rebound.   Musculoskeletal:     Right lower leg: No edema.     Left lower leg: No edema.  Skin:    General: Skin is warm and dry.  Neurological:     General: No focal deficit present.     Mental Status: She is alert and oriented to person, place, and time.  Psychiatric:        Mood and Affect: Mood normal.        Behavior: Behavior normal.     Urine dipstick shows positive for RBC's, positive for nitrates, and positive for leukocytes.  Micro exam: >30 WBC's per HPF, 0 RBC's per HPF, and many + bacteria. Yeast present.       Assessment & Plan:   Marissa Gomez was seen today for vaginal irritation and urinary frequency.  Diagnoses and all orders for this visit:  Acute cystitis without hematuria Bactrim as below. Culture pending. Push fluids.  -     Cancel: Urinalysis -     Urinalysis, Complete -     Urine Culture -     sulfamethoxazole-trimethoprim (BACTRIM DS) 800-160 MG tablet; Take 1 tablet by mouth 2 (two) times daily for 7 days.  Yeast infection Diflucan as below.  -     fluconazole (DIFLUCAN) 150 MG tablet; Take 1 tablet by mouth now. Repeat in 3 days.    Return if symptoms worsen or fail to improve.  The  patient indicates understanding of these issues and agrees with the plan.  Gwenlyn Perking, FNP

## 2023-06-08 LAB — URINE CULTURE

## 2023-07-02 ENCOUNTER — Ambulatory Visit: Payer: Medicare Other

## 2023-07-02 VITALS — Ht 63.0 in | Wt 166.0 lb

## 2023-07-02 DIAGNOSIS — Z0000 Encounter for general adult medical examination without abnormal findings: Secondary | ICD-10-CM

## 2023-07-02 NOTE — Progress Notes (Signed)
Subjective:   Marissa Gomez is a 77 y.o. female who presents for Medicare Annual (Subsequent) preventive examination.  Visit Complete: Virtual  I connected with  Drake Leach on 07/02/23 by a audio enabled telemedicine application and verified that I am speaking with the correct person using two identifiers.  Patient Location: Home  Provider Location: Home Office  I discussed the limitations of evaluation and management by telemedicine. The patient expressed understanding and agreed to proceed.  Patient Medicare AWV questionnaire was completed by the patient on 07/02/2023; I have confirmed that all information answered by patient is correct and no changes since this date.  Review of Systems    Per patient no change in vitals since last visit; unable to obtain new vitals due to this being a telehealth visit.  Patient was unable to self-report vital signs via telehealth due to a lack of equipment at home.  Cardiac Risk Factors include: advanced age (>70men, >70 women);dyslipidemia     Objective:    Today's Vitals   07/02/23 1030  Weight: 166 lb (75.3 kg)  Height: 5\' 3"  (1.6 m)   Body mass index is 29.41 kg/m.     07/02/2023   10:33 AM 06/19/2022    9:56 AM 06/12/2021    8:49 AM 06/08/2020    8:41 AM 06/08/2019    8:27 AM 06/03/2018    9:20 AM 06/02/2017    9:19 AM  Advanced Directives  Does Patient Have a Medical Advance Directive? No No No No No No Yes  Type of Advance Directive       Living will  Would patient like information on creating a medical advance directive? Yes (MAU/Ambulatory/Procedural Areas - Information given) No - Patient declined No - Patient declined No - Patient declined No - Patient declined No - Patient declined     Current Medications (verified) Outpatient Encounter Medications as of 07/02/2023  Medication Sig   Calcium Carbonate-Vit D-Min (CALCIUM 1200 PO) Take by mouth.   cholecalciferol (VITAMIN D) 1000 units tablet Take 1,000 Units by mouth  daily.   Cyanocobalamin (VITAMIN B 12) 500 MCG TABS Take by mouth.   fluconazole (DIFLUCAN) 150 MG tablet Take 1 tablet by mouth now. Repeat in 3 days.   Melatonin 1 MG TABS Take 1 tablet by mouth at bedtime.   Multiple Vitamins-Minerals (CENTRUM SILVER ADULT 50+ PO) Take by mouth.   Omega-3 Fatty Acids (OMEGA-3 FISH OIL PO) omega-3s 720 mg-dha 300 mg-epa 360 mg-fish oil 1,200 mg capsule  Take by oral route.   rosuvastatin (CRESTOR) 10 MG tablet Take 10 mg by mouth at bedtime.   No facility-administered encounter medications on file as of 07/02/2023.    Allergies (verified) Penicillins   History: Past Medical History:  Diagnosis Date   Osteoporosis    Past Surgical History:  Procedure Laterality Date   ABDOMINAL HYSTERECTOMY     EYE SURGERY Right 06/06/2022   GALLBLADDER SURGERY     Family History  Problem Relation Age of Onset   Heart disease Father    Cancer Father        prostate   Social History   Socioeconomic History   Marital status: Married    Spouse name: Donnie   Number of children: 2   Years of education: 12   Highest education level: 12th grade  Occupational History   Occupation: Administrator, arts    Comment: Retired  Tobacco Use   Smoking status: Former    Current packs/day: 0.00  Average packs/day: 0.5 packs/day for 20.0 years (10.0 ttl pk-yrs)    Types: Cigarettes    Start date: 03/21/1989    Quit date: 03/21/2009    Years since quitting: 14.2   Smokeless tobacco: Never  Vaping Use   Vaping status: Never Used  Substance and Sexual Activity   Alcohol use: No   Drug use: No   Sexual activity: Yes    Birth control/protection: Surgical  Other Topics Concern   Not on file  Social History Narrative   Retired, lives with husband Donnie. 2 sons. Enjoys reading.   Social Determinants of Health   Financial Resource Strain: Low Risk  (07/02/2023)   Overall Financial Resource Strain (CARDIA)    Difficulty of Paying Living Expenses: Not hard at  all  Food Insecurity: No Food Insecurity (07/02/2023)   Hunger Vital Sign    Worried About Running Out of Food in the Last Year: Never true    Ran Out of Food in the Last Year: Never true  Transportation Needs: No Transportation Needs (07/02/2023)   PRAPARE - Administrator, Civil Service (Medical): No    Lack of Transportation (Non-Medical): No  Physical Activity: Inactive (07/02/2023)   Exercise Vital Sign    Days of Exercise per Week: 0 days    Minutes of Exercise per Session: 0 min  Stress: No Stress Concern Present (07/02/2023)   [employer] of Occupational Health - Occupational Stress Questionnaire    Feeling of Stress : Not at all  Social Connections: Moderately Integrated (07/02/2023)   Social Connection and Isolation Panel [NHANES]    Frequency of Communication with Friends and Family: More than three times a week    Frequency of Social Gatherings with Friends and Family: More than three times a week    Attends Religious Services: More than 4 times per year    Active Member of [employer] or Organizations: No    Attends Engineer, structural: Never    Marital Status: Married    Tobacco Counseling Counseling given: Not Answered   Clinical Intake:  Pre-visit preparation completed: Yes  Pain : No/denies pain     Nutritional Risks: None Diabetes: No  How often do you need to have someone help you when you read instructions, pamphlets, or other written materials from your doctor or pharmacy?: 1 - Never  Interpreter Needed?: No  Information entered by :: Renie Ora, LPN   Activities of Daily Living    07/02/2023   10:33 AM  In your present state of health, do you have any difficulty performing the following activities:  Hearing? 0  Vision? 0  Difficulty concentrating or making decisions? 0  Walking or climbing stairs? 0  Dressing or bathing? 0  Doing errands, shopping? 0  Preparing Food and eating ? N  Using the Toilet? N  In the past six  months, have you accidently leaked urine? N  Do you have problems with loss of bowel control? N  Managing your Medications? N  Managing your Finances? N  Housekeeping or managing your Housekeeping? N    Patient Care Team: Dettinger, Elige Radon, MD as PCP - General (Family Medicine) Sheran Luz, MD as Consulting Physician (Orthopedic Surgery) Annamaria Helling, MD as Consulting Physician (Obstetrics and Gynecology)  Indicate any recent Medical Services you may have received from other than [facility] providers in the past year (date may be approximate).     Assessment:   This is a routine wellness examination for Shamya.  Hearing/Vision  screen Vision Screening - Comments:: Wears rx glasses - up to date with routine eye exams with  Dr.Johnson   Dietary issues and exercise activities discussed:     Goals Addressed             This Visit's Progress    DIET - EAT MORE FRUITS AND VEGETABLES   On track      Depression Screen    07/02/2023   10:32 AM 06/04/2023    9:26 AM 08/21/2022    8:44 AM 06/19/2022    9:53 AM 11/05/2021   10:39 AM 06/12/2021    8:50 AM 06/08/2020    8:42 AM  PHQ 2/9 Scores  PHQ - 2 Score 0 0 0 0 0 0 0  PHQ- 9 Score 0 0 0        Fall Risk    07/02/2023   10:30 AM 06/04/2023    9:26 AM 08/21/2022    8:45 AM 06/19/2022    9:57 AM 11/05/2021   10:39 AM  Fall Risk   Falls in the past year? 0 0 1 0 0  Number falls in past yr: 0  0 0   Injury with Fall? 0  0 0   Risk for fall due to : No Fall Risks  Other (Comment) No Fall Risks   Risk for fall due to: Comment   uneven pavement    Follow up Falls prevention discussed  Falls prevention discussed Falls prevention discussed     MEDICARE RISK AT HOME:  Medicare Risk at Home - 07/02/23 1031     Any stairs in or around the home? Yes    If so, are there any without handrails? No    Home free of loose throw rugs in walkways, pet beds, electrical cords, etc? Yes    Adequate lighting in your home to reduce risk of  falls? Yes    Life alert? No    Use of a cane, walker or w/c? No    Grab bars in the bathroom? Yes    Shower chair or bench in shower? Yes    Elevated toilet seat or a handicapped toilet? Yes             TIMED UP AND GO:  Was the test performed?  No    Cognitive Function:    06/03/2018    9:29 AM 06/02/2017   11:13 AM 08/24/2015    8:16 AM  MMSE - Mini Mental State Exam  Orientation to time 4 5 5   Orientation to Place 5 5 5   Registration 3 3 3   Attention/ Calculation 5 5 5   Recall 3 3 3   Language- name 2 objects 2 2 2   Language- repeat 1 1 1   Language- follow 3 step command 3 3 3   Language- read & follow direction 1 1 1   Write a sentence 1 1 1   Copy design 1 1 1   Total score 29 30 30         07/02/2023   10:33 AM 06/19/2022   10:00 AM 06/12/2021    8:52 AM 06/08/2020    8:44 AM 06/08/2019    8:29 AM  6CIT Screen  What Year? 0 points 0 points 0 points 0 points 0 points  What month? 0 points 0 points 0 points 0 points 0 points  What time? 0 points 0 points 0 points 0 points 0 points  Count back from 20 0 points 0 points 0 points 0 points 0 points  Months in reverse 0 points 0 points 0 points 0 points 0 points  Repeat phrase 0 points 0 points 0 points 0 points 2 points  Total Score 0 points 0 points 0 points 0 points 2 points    Immunizations Immunization History  Administered Date(s) Administered   Fluad Quad(high Dose 65+) 10/07/2019, 09/27/2020, 10/05/2021, 10/02/2022   Influenza, High Dose Seasonal PF 10/21/2014, 10/28/2015, 10/16/2016, 10/06/2017, 10/06/2018   Influenza,inj,Quad PF,6+ Mos 10/14/2013   Influenza-Unspecified 10/09/2014, 10/09/2016   Pneumococcal Conjugate-13 08/24/2015   Pneumococcal Polysaccharide-23 06/02/2017   Tdap 06/03/2018   Unspecified SARS-COV-2 Vaccination 01/01/2020, 01/29/2020   Zoster Recombinant(Shingrix) 08/13/2018, 02/03/2019   Zoster, Live 04/14/2012    TDAP status: Up to date  Flu Vaccine status: Up to  date  Pneumococcal vaccine status: Up to date  Covid-19 vaccine status: Completed vaccines  Qualifies for Shingles Vaccine? Yes   Zostavax completed Yes   Shingrix Completed?: Yes  Screening Tests Health Maintenance  Topic Date Due   Hepatitis C Screening  Never done   COVID-19 Vaccine (3 - 2023-24 season) 08/09/2022   MAMMOGRAM  07/03/2023   INFLUENZA VACCINE  07/10/2023   Medicare Annual Wellness (AWV)  07/01/2024   DEXA SCAN  07/08/2024   DTaP/Tdap/Td (2 - Td or Tdap) 06/03/2028   Pneumonia Vaccine 70+ Years old  Completed   Zoster Vaccines- Shingrix  Completed   HPV VACCINES  Aged Out   Colonoscopy  Discontinued    Health Maintenance  Health Maintenance Due  Topic Date Due   Hepatitis C Screening  Never done   COVID-19 Vaccine (3 - 2023-24 season) 08/09/2022    Colorectal cancer screening: No longer required.   Mammogram status: No longer required due to age.  Bone Density status: Completed 07/08/2022. Results reflect: Bone density results: OSTEOPOROSIS. Repeat every 2 years.  Lung Cancer Screening: (Low Dose CT Chest recommended if Age 67-80 years, 20 pack-year currently smoking OR have quit w/in 15years.) does not qualify.   Lung Cancer Screening Referral: n/a  Additional Screening:  Hepatitis C Screening: does not qualify;   Vision Screening: Recommended annual ophthalmology exams for early detection of glaucoma and other disorders of the eye. Is the patient up to date with their annual eye exam?  Yes  Who is the provider or what is the name of the office in which the patient attends annual eye exams? Dr.johnson  If pt is not established with a provider, would they like to be referred to a provider to establish care? No .   Dental Screening: Recommended annual dental exams for proper oral hygiene    Community Resource Referral / Chronic Care Management: CRR required this visit?  No   CCM required this visit?  No     Plan:     I have personally  reviewed and noted the following in the patient's chart:   Medical and social history Use of alcohol, tobacco or illicit drugs  Current medications and supplements including opioid prescriptions. Patient is not currently taking opioid prescriptions. Functional ability and status Nutritional status Physical activity Advanced directives List of other physicians Hospitalizations, surgeries, and ER visits in previous 12 months Vitals Screenings to include cognitive, depression, and falls Referrals and appointments  In addition, I have reviewed and discussed with patient certain preventive protocols, quality metrics, and best practice recommendations. A written personalized care plan for preventive services as well as general preventive health recommendations were provided to patient.     Lorrene Reid, LPN   1/61/0960  After Visit Summary: (MyChart) Due to this being a telephonic visit, the after visit summary with patients personalized plan was offered to patient via MyChart   Nurse Notes: none

## 2023-07-02 NOTE — Patient Instructions (Signed)
Marissa Gomez , Thank you for taking time to come for your Medicare Wellness Visit. I appreciate your ongoing commitment to your health goals. Please review the following plan we discussed and let me know if I can assist you in the future.   These are the goals we discussed:  Goals      DIET - EAT MORE FRUITS AND VEGETABLES     DIET - INCREASE WATER INTAKE     You should drink at least 64 oz of water daily     Exercise 3x per week (30 min per time)     Increase walking to 20-30 minutes per day at least 3 times per week     Patient Stated     06/08/2020 AWV Goal: Exercise for General Health  Patient will verbalize understanding of the benefits of increased physical activity: Exercising regularly is important. It will improve your overall fitness, flexibility, and endurance. Regular exercise also will improve your overall health. It can help you control your weight, reduce stress, and improve your bone density. Over the next year, patient will increase physical activity as tolerated with a goal of at least 150 minutes of moderate physical activity per week.  You can tell that you are exercising at a moderate intensity if your heart starts beating faster and you start breathing faster but can still hold a conversation. Moderate-intensity exercise ideas include: Walking 1 mile (1.6 km) in about 15 minutes Biking Hiking Golfing Dancing Water aerobics Patient will verbalize understanding of everyday activities that increase physical activity by providing examples like the following: Yard work, such as: Insurance underwriter Gardening Washing windows or floors Patient will be able to explain general safety guidelines for exercising:  Before you start a new exercise program, talk with your health care provider. Do not exercise so much that you hurt yourself, feel dizzy, or get very short of breath. Wear comfortable  clothes and wear shoes with good support. Drink plenty of water while you exercise to prevent dehydration or heat stroke. Work out until your breathing and your heartbeat get faster.         This is a list of the screening recommended for you and due dates:  Health Maintenance  Topic Date Due   Hepatitis C Screening  Never done   COVID-19 Vaccine (3 - 2023-24 season) 08/09/2022   Mammogram  07/03/2023   Flu Shot  07/10/2023   Medicare Annual Wellness Visit  07/01/2024   DEXA scan (bone density measurement)  07/08/2024   DTaP/Tdap/Td vaccine (2 - Td or Tdap) 06/03/2028   Pneumonia Vaccine  Completed   Zoster (Shingles) Vaccine  Completed   HPV Vaccine  Aged Out   Colon Cancer Screening  Discontinued    Advanced directives: Advance directive discussed with you today. I have provided a copy for you to complete at home and have notarized. Once this is complete please bring a copy in to our office so we can scan it into your chart. Information on Advanced Care Planning can be found at [facility] [place] Advance Health Care Directives Advance Health Care Directives ([email or web address]    Conditions/risks identified: Aim for 30 minutes of exercise or brisk walking, 6-8 glasses of water, and 5 servings of fruits and vegetables each day.   Next appointment: Follow up in one year for your annual wellness visit    Preventive Care 65 Years and Older, Female  Preventive care refers to lifestyle choices and visits with your health care provider that can promote health and wellness. What does preventive care include? A yearly physical exam. This is also called an annual well check. Dental exams once or twice a year. Routine eye exams. Ask your health care provider how often you should have your eyes checked. Personal lifestyle choices, including: Daily care of your teeth and gums. Regular physical activity. Eating a healthy diet. Avoiding tobacco and drug use. Limiting alcohol  use. Practicing safe sex. Taking low-dose aspirin every day. Taking vitamin and mineral supplements as recommended by your health care provider. What happens during an annual well check? The services and screenings done by your health care provider during your annual well check will depend on your age, overall health, lifestyle risk factors, and family history of disease. Counseling  Your health care provider may ask you questions about your: Alcohol use. Tobacco use. Drug use. Emotional well-being. Home and relationship well-being. Sexual activity. Eating habits. History of falls. Memory and ability to understand (cognition). Work and work Astronomer. Reproductive health. Screening  You may have the following tests or measurements: Height, weight, and BMI. Blood pressure. Lipid and cholesterol levels. These may be checked every 5 years, or more frequently if you are over 61 years old. Skin check. Lung cancer screening. You may have this screening every year starting at age 71 if you have a 30-pack-year history of smoking and currently smoke or have quit within the past 15 years. Fecal occult blood test (FOBT) of the stool. You may have this test every year starting at age 87. Flexible sigmoidoscopy or colonoscopy. You may have a sigmoidoscopy every 5 years or a colonoscopy every 10 years starting at age 18. Hepatitis C blood test. Hepatitis B blood test. Sexually transmitted disease (STD) testing. Diabetes screening. This is done by checking your blood sugar (glucose) after you have not eaten for a while (fasting). You may have this done every 1-3 years. Bone density scan. This is done to screen for osteoporosis. You may have this done starting at age 33. Mammogram. This may be done every 1-2 years. Talk to your health care provider about how often you should have regular mammograms. Talk with your health care provider about your test results, treatment options, and if necessary,  the need for more tests. Vaccines  Your health care provider may recommend certain vaccines, such as: Influenza vaccine. This is recommended every year. Tetanus, diphtheria, and acellular pertussis (Tdap, Td) vaccine. You may need a Td booster every 10 years. Zoster vaccine. You may need this after age 2. Pneumococcal 13-valent conjugate (PCV13) vaccine. One dose is recommended after age 18. Pneumococcal polysaccharide (PPSV23) vaccine. One dose is recommended after age 29. Talk to your health care provider about which screenings and vaccines you need and how often you need them. This information is not intended to replace advice given to you by your health care provider. Make sure you discuss any questions you have with your health care provider. Document Released: 12/22/2015 Document Revised: 08/14/2016 Document Reviewed: 09/26/2015 Elsevier Interactive Patient Education  2017 [employer].  Fall Prevention in the Home Falls can cause injuries. They can happen to people of all ages. There are many things you can do to make your home safe and to help prevent falls. What can I do on the outside of my home? Regularly fix the edges of walkways and driveways and fix any cracks. Remove anything that might make you trip as  you walk through a door, such as a raised step or threshold. Trim any bushes or trees on the path to your home. Use bright outdoor lighting. Clear any walking paths of anything that might make someone trip, such as rocks or tools. Regularly check to see if handrails are loose or broken. Make sure that both sides of any steps have handrails. Any raised decks and porches should have guardrails on the edges. Have any leaves, snow, or ice cleared regularly. Use sand or salt on walking paths during winter. Clean up any spills in your garage right away. This includes oil or grease spills. What can I do in the bathroom? Use night lights. Install grab bars by the toilet and in the  tub and shower. Do not use towel bars as grab bars. Use non-skid mats or decals in the tub or shower. If you need to sit down in the shower, use a plastic, non-slip stool. Keep the floor dry. Clean up any water that spills on the floor as soon as it happens. Remove soap buildup in the tub or shower regularly. Attach bath mats securely with double-sided non-slip rug tape. Do not have throw rugs and other things on the floor that can make you trip. What can I do in the bedroom? Use night lights. Make sure that you have a light by your bed that is easy to reach. Do not use any sheets or blankets that are too big for your bed. They should not hang down onto the floor. Have a firm chair that has side arms. You can use this for support while you get dressed. Do not have throw rugs and other things on the floor that can make you trip. What can I do in the kitchen? Clean up any spills right away. Avoid walking on wet floors. Keep items that you use a lot in easy-to-reach places. If you need to reach something above you, use a strong step stool that has a grab bar. Keep electrical cords out of the way. Do not use floor polish or wax that makes floors slippery. If you must use wax, use non-skid floor wax. Do not have throw rugs and other things on the floor that can make you trip. What can I do with my stairs? Do not leave any items on the stairs. Make sure that there are handrails on both sides of the stairs and use them. Fix handrails that are broken or loose. Make sure that handrails are as long as the stairways. Check any carpeting to make sure that it is firmly attached to the stairs. Fix any carpet that is loose or worn. Avoid having throw rugs at the top or bottom of the stairs. If you do have throw rugs, attach them to the floor with carpet tape. Make sure that you have a light switch at the top of the stairs and the bottom of the stairs. If you do not have them, ask someone to add them for  you. What else can I do to help prevent falls? Wear shoes that: Do not have high heels. Have rubber bottoms. Are comfortable and fit you well. Are closed at the toe. Do not wear sandals. If you use a stepladder: Make sure that it is fully opened. Do not climb a closed stepladder. Make sure that both sides of the stepladder are locked into place. Ask someone to hold it for you, if possible. Clearly mark and make sure that you can see: Any grab bars or handrails.  First and last steps. Where the edge of each step is. Use tools that help you move around (mobility aids) if they are needed. These include: Canes. Walkers. Scooters. Crutches. Turn on the lights when you go into a dark area. Replace any light bulbs as soon as they burn out. Set up your furniture so you have a clear path. Avoid moving your furniture around. If any of your floors are uneven, fix them. If there are any pets around you, be aware of where they are. Review your medicines with your doctor. Some medicines can make you feel dizzy. This can increase your chance of falling. Ask your doctor what other things that you can do to help prevent falls. This information is not intended to replace advice given to you by your health care provider. Make sure you discuss any questions you have with your health care provider. Document Released: 09/21/2009 Document Revised: 05/02/2016 Document Reviewed: 12/30/2014 Elsevier Interactive Patient Education  2017 [employer].

## 2023-07-14 DIAGNOSIS — Z0000 Encounter for general adult medical examination without abnormal findings: Secondary | ICD-10-CM | POA: Diagnosis not present

## 2023-07-14 DIAGNOSIS — Z1231 Encounter for screening mammogram for malignant neoplasm of breast: Secondary | ICD-10-CM | POA: Diagnosis not present

## 2023-07-29 ENCOUNTER — Encounter: Payer: Self-pay | Admitting: Family Medicine

## 2023-10-07 ENCOUNTER — Ambulatory Visit (INDEPENDENT_AMBULATORY_CARE_PROVIDER_SITE_OTHER): Payer: Medicare Other

## 2023-10-07 DIAGNOSIS — Z23 Encounter for immunization: Secondary | ICD-10-CM

## 2024-06-02 ENCOUNTER — Ambulatory Visit: Admitting: Family Medicine

## 2024-06-02 ENCOUNTER — Encounter: Payer: Self-pay | Admitting: Family Medicine

## 2024-06-02 VITALS — BP 133/77 | Temp 97.5°F | Ht 63.0 in | Wt 165.0 lb

## 2024-06-02 DIAGNOSIS — H6123 Impacted cerumen, bilateral: Secondary | ICD-10-CM

## 2024-06-02 NOTE — Progress Notes (Signed)
 Chief Complaint  Patient presents with   Cerumen Impaction    Believes her ears are impacted. Some discomfort in left ear.     HPI  Patient presents today for diminished hearing and ear discomfort. Requests lavage.   PMH: Smoking status noted Review of Systems  Objective: BP 133/77   Temp (!) 97.5 F (36.4 C)   Ht 5' 3 (1.6 m)   Wt 165 lb (74.8 kg)   SpO2 93%   BMI 29.23 kg/m  Gen: NAD, alert, cooperative with exam HEENT: NCAT, EOMI, PERRL. Tms occluded by cerumen.  CV: RRR, good S1/S2, no murmur Resp: CTABL, no wheezes, non-labored Neuro: Alert and oriented, No gross deficits  Lavage performed. Large clots of cerumen removed AU. Subsequently hearing is improved and Tms visiualized and are normal.   Bilateral hearing loss due to cerumen impaction   Lavage completed with resolution of symptoms Follow up as needed.   Butler Der, MD

## 2024-06-09 ENCOUNTER — Ambulatory Visit

## 2024-06-09 VITALS — BP 133/77 | Ht 63.0 in | Wt 165.0 lb

## 2024-06-09 DIAGNOSIS — Z0000 Encounter for general adult medical examination without abnormal findings: Secondary | ICD-10-CM | POA: Diagnosis not present

## 2024-06-09 NOTE — Progress Notes (Signed)
 Subjective:   Marissa Gomez is a 78 y.o. who presents for a Medicare Wellness preventive visit.  As a reminder, Annual Wellness Visits don't include a physical exam, and some assessments may be limited, especially if this visit is performed virtually. We may recommend an in-person follow-up visit with your provider if needed.  Visit Complete: Virtual I connected with  Marissa Gomez on 06/09/24 by a audio enabled telemedicine application and verified that I am speaking with the correct person using two identifiers.  Patient Location: Home  Provider Location: Home Office  I discussed the limitations of evaluation and management by telemedicine. The patient expressed understanding and agreed to proceed.  Vital Signs: Because this visit was a virtual/telehealth visit, some criteria may be missing or patient reported. Any vitals not documented were not able to be obtained and vitals that have been documented are patient reported.  VideoDeclined- This patient declined Librarian, academic. Therefore the visit was completed with audio only.  Persons Participating in Visit: Patient.  AWV Questionnaire: No: Patient Medicare AWV questionnaire was not completed prior to this visit.  Cardiac Risk Factors include: advanced age (>68men, >61 women)     Objective:    Today's Vitals   06/09/24 1002  BP: 133/77  Weight: 165 lb (74.8 kg)  Height: 5' 3 (1.6 m)   Body mass index is 29.23 kg/m.     06/09/2024   10:07 AM 07/02/2023   10:33 AM 06/19/2022    9:56 AM 06/12/2021    8:49 AM 06/08/2020    8:41 AM 06/08/2019    8:27 AM 06/03/2018    9:20 AM  Advanced Directives  Does Patient Have a Medical Advance Directive? No No No No No No No   Would patient like information on creating a medical advance directive?  Yes (MAU/Ambulatory/Procedural Areas - Information given) No - Patient declined No - Patient declined No - Patient declined No - Patient declined  No - Patient  declined      Data saved with a previous flowsheet row definition    Current Medications (verified) Outpatient Encounter Medications as of 06/09/2024  Medication Sig   Calcium Carbonate-Vit D-Min (CALCIUM 1200 PO) Take by mouth.   cholecalciferol (VITAMIN D) 1000 units tablet Take 1,000 Units by mouth daily.   Cyanocobalamin (VITAMIN B 12) 500 MCG TABS Take by mouth.   Melatonin 1 MG TABS Take 1 tablet by mouth at bedtime.   Multiple Vitamins-Minerals (CENTRUM SILVER ADULT 50+ PO) Take by mouth.   Omega-3 Fatty Acids (OMEGA-3 FISH OIL PO) omega-3s 720 mg-dha 300 mg-epa 360 mg-fish oil 1,200 mg capsule  Take by oral route.   rosuvastatin (CRESTOR) 10 MG tablet Take 10 mg by mouth at bedtime.   fluconazole  (DIFLUCAN ) 150 MG tablet Take 1 tablet by mouth now. Repeat in 3 days. (Patient not taking: Reported on 06/02/2024)   No facility-administered encounter medications on file as of 06/09/2024.    Allergies (verified) Penicillins   History: Past Medical History:  Diagnosis Date   Osteoporosis    Past Surgical History:  Procedure Laterality Date   ABDOMINAL HYSTERECTOMY     EYE SURGERY Right 06/06/2022   GALLBLADDER SURGERY     Family History  Problem Relation Age of Onset   Heart disease Father    Cancer Father        prostate   Social History   Socioeconomic History   Marital status: Married    Spouse name: Donnie  Number of children: 2   Years of education: 12   Highest education level: 12th grade  Occupational History   Occupation: Administrator, arts    Comment: Retired  Tobacco Use   Smoking status: Former    Current packs/day: 0.00    Average packs/day: 0.5 packs/day for 20.0 years (10.0 ttl pk-yrs)    Types: Cigarettes    Start date: 03/21/1989    Quit date: 03/21/2009    Years since quitting: 15.2   Smokeless tobacco: Never  Vaping Use   Vaping status: Never Used  Substance and Sexual Activity   Alcohol use: No   Drug use: No   Sexual activity: Yes     Birth control/protection: Surgical  Other Topics Concern   Not on file  Social History Narrative   Retired, lives with husband Donnie. 2 sons. Enjoys reading.   Social Drivers of Corporate investment banker Strain: Low Risk  (06/09/2024)   Overall Financial Resource Strain (CARDIA)    Difficulty of Paying Living Expenses: Not hard at all  Food Insecurity: No Food Insecurity (06/09/2024)   Hunger Vital Sign    Worried About Running Out of Food in the Last Year: Never true    Ran Out of Food in the Last Year: Never true  Transportation Needs: No Transportation Needs (06/09/2024)   PRAPARE - Administrator, Civil Service (Medical): No    Lack of Transportation (Non-Medical): No  Physical Activity: Insufficiently Active (06/09/2024)   Exercise Vital Sign    Days of Exercise per Week: 2 days    Minutes of Exercise per Session: 30 min  Stress: No Stress Concern Present (06/09/2024)   [employer] of Occupational Health - Occupational Stress Questionnaire    Feeling of Stress: Not at all  Social Connections: Socially Integrated (06/09/2024)   Social Connection and Isolation Panel    Frequency of Communication with Friends and Family: More than three times a week    Frequency of Social Gatherings with Friends and Family: More than three times a week    Attends Religious Services: More than 4 times per year    Active Member of [employer] or Organizations: Yes    Attends Engineer, structural: More than 4 times per year    Marital Status: Married    Tobacco Counseling Counseling given: Yes    Clinical Intake:  Pre-visit preparation completed: Yes  Pain : No/denies pain     BMI - recorded: 29.23 Nutritional Status: BMI 25 -29 Overweight Nutritional Risks: None Diabetes: No  No results found for: HGBA1C   How often do you need to have someone help you when you read instructions, pamphlets, or other written materials from your doctor or pharmacy?: 1 -  Never  Interpreter Needed?: No  Information entered by :: alia t/cma   Activities of Daily Living     06/09/2024   10:07 AM 07/02/2023   10:33 AM  In your present state of health, do you have any difficulty performing the following activities:  Hearing? 0 0  Vision? 0 0  Difficulty concentrating or making decisions? 0 0  Walking or climbing stairs? 0 0  Dressing or bathing? 0 0  Doing errands, shopping? 0 0  Preparing Food and eating ? N N  Using the Toilet? N N  In the past six months, have you accidently leaked urine? Y N  Do you have problems with loss of bowel control? N N  Managing your Medications? N  N  Managing your Finances? N N  Housekeeping or managing your Housekeeping? N N    Patient Care Team: Dettinger, Fonda LABOR, MD as PCP - General (Family Medicine) Bonner Ade, MD as Consulting Physician (Orthopedic Surgery) Rox Charleston, MD as Consulting Physician (Obstetrics and Gynecology)  I have updated your Care Teams any recent Medical Services you may have received from other providers in the past year.     Assessment:   This is a routine wellness examination for Vicktoria.  Hearing/Vision screen Hearing Screening - Comments:: Pt denies hearing dif Vision Screening - Comments:: Pt denies vision dif/pt goes [facility] In [place]/last ov 66yrs   Goals Addressed             This Visit's Progress    Exercise 3x per week (30 min per time)   On track    Increase walking to 20-30 minutes per day at least 3 times per week     Patient Stated   On track    06/08/2020 AWV Goal: Exercise for General Health  Patient will verbalize understanding of the benefits of increased physical activity: Exercising regularly is important. It will improve your overall fitness, flexibility, and endurance. Regular exercise also will improve your overall health. It can help you control your weight, reduce stress, and improve your bone density. Over the next year, patient will  increase physical activity as tolerated with a goal of at least 150 minutes of moderate physical activity per week.  You can tell that you are exercising at a moderate intensity if your heart starts beating faster and you start breathing faster but can still hold a conversation. Moderate-intensity exercise ideas include: Walking 1 mile (1.6 km) in about 15 minutes Biking Hiking Golfing Dancing Water aerobics Patient will verbalize understanding of everyday activities that increase physical activity by providing examples like the following: Yard work, such as: Insurance underwriter Gardening Washing windows or floors Patient will be able to explain general safety guidelines for exercising:  Before you start a new exercise program, talk with your health care provider. Do not exercise so much that you hurt yourself, feel dizzy, or get very short of breath. Wear comfortable clothes and wear shoes with good support. Drink plenty of water while you exercise to prevent dehydration or heat stroke. Work out until your breathing and your heartbeat get faster.        Depression Screen     06/09/2024   10:10 AM 07/02/2023   10:32 AM 06/04/2023    9:26 AM 08/21/2022    8:44 AM 06/19/2022    9:53 AM 11/05/2021   10:39 AM 06/12/2021    8:50 AM  PHQ 2/9 Scores  PHQ - 2 Score 0 0 0 0 0 0 0  PHQ- 9 Score 0 0 0 0       Fall Risk     06/09/2024   10:05 AM 07/02/2023   10:30 AM 06/04/2023    9:26 AM 08/21/2022    8:45 AM 06/19/2022    9:57 AM  Fall Risk   Falls in the past year? 0 0 0 1 0  Number falls in past yr: 0 0  0 0  Injury with Fall? 0 0  0 0  Risk for fall due to : No Fall Risks No Fall Risks  Other (Comment) No Fall Risks  Risk for fall due to: Comment    uneven pavement  Follow up Falls evaluation completed Falls prevention discussed  Falls prevention discussed  Falls prevention discussed      Data saved  with a previous flowsheet row definition    MEDICARE RISK AT HOME:  Medicare Risk at Home Any stairs in or around the home?: Yes If so, are there any without handrails?: Yes Home free of loose throw rugs in walkways, pet beds, electrical cords, etc?: Yes Adequate lighting in your home to reduce risk of falls?: Yes Life alert?: No Use of a cane, walker or w/c?: No Grab bars in the bathroom?: No Shower chair or bench in shower?: Yes Elevated toilet seat or a handicapped toilet?: Yes  TIMED UP AND GO:  Was the test performed?  no  Cognitive Function: 6CIT completed    06/03/2018    9:29 AM 06/02/2017   11:13 AM 08/24/2015    8:16 AM  MMSE - Mini Mental State Exam  Orientation to time 4 5  5    Orientation to Place 5 5  5    Registration 3 3  3    Attention/ Calculation 5 5  5    Recall 3 3  3    Language- name 2 objects 2 2  2    Language- repeat 1 1 1   Language- follow 3 step command 3 3  3    Language- read & follow direction 1 1  1    Write a sentence 1 1  1    Copy design 1 1  1    Total score 29 30  30       Data saved with a previous flowsheet row definition        06/09/2024   10:08 AM 07/02/2023   10:33 AM 06/19/2022   10:00 AM 06/12/2021    8:52 AM 06/08/2020    8:44 AM  6CIT Screen  What Year? 0 points 0 points 0 points 0 points 0 points  What month? 0 points 0 points 0 points 0 points 0 points  What time? 0 points 0 points 0 points 0 points 0 points  Count back from 20 0 points 0 points 0 points 0 points 0 points  Months in reverse 0 points 0 points 0 points 0 points 0 points  Repeat phrase 0 points 0 points 0 points 0 points 0 points  Total Score 0 points 0 points 0 points 0 points 0 points    Immunizations Immunization History  Administered Date(s) Administered   Fluad Quad(high Dose 65+) 10/07/2019, 09/27/2020, 10/05/2021, 10/02/2022   Fluad Trivalent(High Dose 65+) 10/07/2023   Influenza, High Dose Seasonal PF 10/21/2014, 10/28/2015, 10/16/2016, 10/06/2017,  10/06/2018   Influenza,inj,Quad PF,6+ Mos 10/14/2013   Influenza-Unspecified 10/09/2014, 10/09/2016   Pneumococcal Conjugate-13 08/24/2015   Pneumococcal Polysaccharide-23 06/02/2017   Tdap 06/03/2018   Unspecified SARS-COV-2 Vaccination 01/01/2020, 01/29/2020   Zoster Recombinant(Shingrix ) 08/13/2018, 02/03/2019   Zoster, Live 04/14/2012    Screening Tests Health Maintenance  Topic Date Due   COVID-19 Vaccine (3 - 2024-25 season) 08/10/2023   Medicare Annual Wellness (AWV)  07/01/2024   DEXA SCAN  07/08/2024   INFLUENZA VACCINE  07/09/2024   MAMMOGRAM  07/13/2024   DTaP/Tdap/Td (2 - Td or Tdap) 06/03/2028   Pneumococcal Vaccine: 50+ Years  Completed   Zoster Vaccines- Shingrix   Completed   Hepatitis B Vaccines  Aged Out   HPV VACCINES  Aged Out   Meningococcal B Vaccine  Aged Out   Colonoscopy  Discontinued    Health Maintenance  Health Maintenance Due  Topic Date Due  COVID-19 Vaccine (3 - 2024-25 season) 08/10/2023   Medicare Annual Wellness (AWV)  07/01/2024   Health Maintenance Items Addressed: See Nurse Notes at the end of this note  Additional Screening:  Vision Screening: Recommended annual ophthalmology exams for early detection of glaucoma and other disorders of the eye. Would you like a referral to an eye doctor? No    Dental Screening: Recommended annual dental exams for proper oral hygiene  Community Resource Referral / Chronic Care Management: CRR required this visit?  No   CCM required this visit?  No   Plan:    I have personally reviewed and noted the following in the patient's chart:   Medical and social history Use of alcohol, tobacco or illicit drugs  Current medications and supplements including opioid prescriptions. Patient is not currently taking opioid prescriptions. Functional ability and status Nutritional status Physical activity Advanced directives List of other physicians Hospitalizations, surgeries, and ER visits in  previous 12 months Vitals Screenings to include cognitive, depression, and falls Referrals and appointments  In addition, I have reviewed and discussed with patient certain preventive protocols, quality metrics, and best practice recommendations. A written personalized care plan for preventive services as well as general preventive health recommendations were provided to patient.   Ozie Ned, CMA   06/09/2024   After Visit Summary: (MyChart) Due to this being a telephonic visit, the after visit summary with patients personalized plan was offered to patient via MyChart   Notes: Nothing significant to report at this time.

## 2024-06-09 NOTE — Patient Instructions (Signed)
 Marissa Gomez , Thank you for taking time out of your busy schedule to complete your Annual Wellness Visit with me. I enjoyed our conversation and look forward to speaking with you again next year. I, as well as your care team,  appreciate your ongoing commitment to your health goals. Please review the following plan we discussed and let me know if I can assist you in the future. Your Game plan/ To Do List    Follow up Visits: Next Medicare AWV with our clinical staff: 06/13/25 at 10:00a.m.   Next Office Visit with your provider: n/a  Clinician Recommendations:  Aim for 30 minutes of exercise or brisk walking, 6-8 glasses of water, and 5 servings of fruits and vegetables each day.       This is a list of the screening recommended for you and due dates:  Health Maintenance  Topic Date Due   COVID-19 Vaccine (3 - 2024-25 season) 08/10/2023   Medicare Annual Wellness Visit  07/01/2024   DEXA scan (bone density measurement)  07/08/2024   Flu Shot  07/09/2024   Mammogram  07/13/2024   DTaP/Tdap/Td vaccine (2 - Td or Tdap) 06/03/2028   Pneumococcal Vaccine for age over 60  Completed   Zoster (Shingles) Vaccine  Completed   Hepatitis B Vaccine  Aged Out   HPV Vaccine  Aged Out   Meningitis B Vaccine  Aged Out   Colon Cancer Screening  Discontinued    Advanced directives: (Declined) Advance directive discussed with you today. Even though you declined this today, please call our office should you change your mind, and we can give you the proper paperwork for you to fill out. Advance Care Planning is important because it:  [x]  Makes sure you receive the medical care that is consistent with your values, goals, and preferences  [x]  It provides guidance to your family and loved ones and reduces their decisional burden about whether or not they are making the right decisions based on your wishes.  Follow the link provided in your after visit summary or read over the paperwork we have mailed to you to  help you started getting your Advance Directives in place. If you need assistance in completing these, please reach out to us  so that we can help you!  See attachments for Preventive Care and Fall Prevention Tips.

## 2024-07-19 DIAGNOSIS — Z0000 Encounter for general adult medical examination without abnormal findings: Secondary | ICD-10-CM | POA: Diagnosis not present

## 2024-07-19 DIAGNOSIS — Z1231 Encounter for screening mammogram for malignant neoplasm of breast: Secondary | ICD-10-CM | POA: Diagnosis not present

## 2024-07-19 LAB — HM MAMMOGRAPHY

## 2024-07-22 LAB — LAB REPORT - SCANNED
A1c: 5.9
EGFR: 69
TSH: 3.51 (ref 0.41–5.90)

## 2024-09-13 DIAGNOSIS — M8589 Other specified disorders of bone density and structure, multiple sites: Secondary | ICD-10-CM | POA: Diagnosis not present

## 2024-09-13 LAB — HM DEXA SCAN

## 2024-09-22 ENCOUNTER — Encounter: Payer: Self-pay | Admitting: Nurse Practitioner

## 2024-09-22 ENCOUNTER — Ambulatory Visit: Payer: Self-pay | Admitting: Family Medicine

## 2024-09-22 ENCOUNTER — Ambulatory Visit: Admitting: Nurse Practitioner

## 2024-09-22 VITALS — BP 124/72 | HR 99 | Temp 98.0°F | Ht 63.0 in | Wt 162.8 lb

## 2024-09-22 DIAGNOSIS — J029 Acute pharyngitis, unspecified: Secondary | ICD-10-CM | POA: Diagnosis not present

## 2024-09-22 MED ORDER — AZITHROMYCIN 250 MG PO TABS: ORAL_TABLET | ORAL | 0 refills | Status: AC

## 2024-09-22 NOTE — Progress Notes (Signed)
 Subjective:  Patient ID: Marissa Gomez Ill, female    DOB: 10/29/1946, 78 y.o.   MRN: [id number]  Patient Care Team: Dettinger, Fonda LABOR, MD as PCP - General (Family Medicine) Bonner Ade, MD as Consulting Physician (Orthopedic Surgery) Rox Charleston, MD as Consulting Physician (Obstetrics and Gynecology)   Chief Complaint:  Sore Throat (Sore throat for a week )   HPI: Marissa Gomez is a 78 y.o. female presenting on 09/22/2024 for Sore Throat (Sore throat for a week )   Discussed the use of AI scribe software for clinical note transcription with the patient, who gave verbal consent to proceed.  History of Present Illness Marissa Gomez is an 78 year old female who presents with a sore throat persisting for one week.  The sore throat began a week ago with symptoms resembling laryngitis, causing significant hoarseness. Her voice improved the following day, but the sore throat persisted with varying severity. Today, the soreness is worse than the previous day, prompting her visit.  She has been using warm salt water gargles and taking Tylenol and Advil without relief. The salt water provides temporary relief but does not resolve the symptoms. No fever, significant nasal congestion, or ear pain, although she occasionally experiences a sensation akin to a sinus headache. She reports a slight cough that sometimes 'tickles' her throat.  Her appetite and fluid intake remain normal. Swallowing is now painful. She has never had a sinus infection.      Relevant past medical, surgical, family, and social history reviewed and updated as indicated.  Allergies and medications reviewed and updated. Data reviewed: Chart in Epic.   Past Medical History:  Diagnosis Date   Osteoporosis     Past Surgical History:  Procedure Laterality Date   ABDOMINAL HYSTERECTOMY     EYE SURGERY Right 06/06/2022   GALLBLADDER SURGERY      Social History   Socioeconomic History   Marital status:  Married    Spouse name: Donnie   Number of children: 2   Years of education: 12   Highest education level: 12th grade  Occupational History   Occupation: Administrator, arts    Comment: Retired  Tobacco Use   Smoking status: Former    Current packs/day: 0.00    Average packs/day: 0.5 packs/day for 20.0 years (10.0 ttl pk-yrs)    Types: Cigarettes    Start date: 03/21/1989    Quit date: 03/21/2009    Years since quitting: 15.5   Smokeless tobacco: Never  Vaping Use   Vaping status: Never Used  Substance and Sexual Activity   Alcohol use: No   Drug use: No   Sexual activity: Yes    Birth control/protection: Surgical  Other Topics Concern   Not on file  Social History Narrative   Retired, lives with husband Donnie. 2 sons. Enjoys reading.   Social Drivers of Corporate investment banker Strain: Low Risk  (06/09/2024)   Overall Financial Resource Strain (CARDIA)    Difficulty of Paying Living Expenses: Not hard at all  Food Insecurity: No Food Insecurity (06/09/2024)   Hunger Vital Sign    Worried About Running Out of Food in the Last Year: Never true    Ran Out of Food in the Last Year: Never true  Transportation Needs: No Transportation Needs (06/09/2024)   PRAPARE - Administrator, Civil Service (Medical): No    Lack of Transportation (Non-Medical): No  Physical Activity: Insufficiently Active (06/09/2024)  Exercise Vital Sign    Days of Exercise per Week: 2 days    Minutes of Exercise per Session: 30 min  Stress: No Stress Concern Present (06/09/2024)   [employer] of Occupational Health - Occupational Stress Questionnaire    Feeling of Stress: Not at all  Social Connections: Socially Integrated (06/09/2024)   Social Connection and Isolation Panel    Frequency of Communication with Friends and Family: More than three times a week    Frequency of Social Gatherings with Friends and Family: More than three times a week    Attends Religious Services: More than 4  times per year    Active Member of [employer] or Organizations: Yes    Attends Engineer, structural: More than 4 times per year    Marital Status: Married  Catering manager Violence: Not At Risk (06/09/2024)   Humiliation, Afraid, Rape, and Kick questionnaire    Fear of Current or Ex-Partner: No    Emotionally Abused: No    Physically Abused: No    Sexually Abused: No    Outpatient Encounter Medications as of 09/22/2024  Medication Sig   azithromycin  (ZITHROMAX ) 250 MG tablet Take 2 tablets on day 1, then 1 tablet daily on days 2 through 5   Calcium Carbonate-Vit D-Min (CALCIUM 1200 PO) Take by mouth.   cholecalciferol (VITAMIN D) 1000 units tablet Take 1,000 Units by mouth daily.   Cyanocobalamin (VITAMIN B 12) 500 MCG TABS Take by mouth.   Melatonin 1 MG TABS Take 1 tablet by mouth at bedtime.   Multiple Vitamins-Minerals (CENTRUM SILVER ADULT 50+ PO) Take by mouth.   Omega-3 Fatty Acids (OMEGA-3 FISH OIL PO) omega-3s 720 mg-dha 300 mg-epa 360 mg-fish oil 1,200 mg capsule  Take by oral route.   rosuvastatin (CRESTOR) 10 MG tablet Take 10 mg by mouth at bedtime.   fluconazole  (DIFLUCAN ) 150 MG tablet Take 1 tablet by mouth now. Repeat in 3 days. (Patient not taking: Reported on 09/22/2024)   No facility-administered encounter medications on file as of 09/22/2024.    Allergies  Allergen Reactions   Penicillins Rash    Pertinent ROS per HPI, otherwise unremarkable      Objective:  BP 124/72   Pulse 99   Temp 98 F (36.7 C) (Temporal)   Ht 5' 3 (1.6 m)   Wt 162 lb 12.8 oz (73.8 kg)   SpO2 96%   BMI 28.84 kg/m    Wt Readings from Last 3 Encounters:  09/22/24 162 lb 12.8 oz (73.8 kg)  06/09/24 165 lb (74.8 kg)  06/02/24 165 lb (74.8 kg)    Physical Exam Vitals and nursing note reviewed.  Constitutional:      General: She is not in acute distress. HENT:     Head: Normocephalic and atraumatic.     Right Ear: Tympanic membrane, ear canal and external ear  normal. There is no impacted cerumen.     Left Ear: Tympanic membrane, ear canal and external ear normal. There is no impacted cerumen.     Nose: Nose normal. No congestion.     Mouth/Throat:     Tongue: No lesions. Tongue does not deviate from midline.     Palate: No mass.     Pharynx: Posterior oropharyngeal erythema present. No oropharyngeal exudate or postnasal drip.  Neck:     Vascular: No carotid bruit.  Cardiovascular:     Heart sounds: Normal heart sounds.  Pulmonary:     Effort: Pulmonary effort is normal.  Breath sounds: Normal breath sounds.  Musculoskeletal:     Cervical back: Normal range of motion and neck supple. No rigidity or tenderness.  Lymphadenopathy:     Cervical: No cervical adenopathy.  Skin:    General: Skin is warm and dry.     Findings: No rash.  Neurological:     Mental Status: She is alert and oriented to person, place, and time.  Psychiatric:        Mood and Affect: Mood normal.        Behavior: Behavior normal.        Thought Content: Thought content normal.        Judgment: Judgment normal.    Physical Exam HEENT: Throat slightly inflamed. Nasal turbinates clear.     Results for orders placed or performed in visit on 09/15/24  HM DEXA SCAN   Collection Time: 09/13/24  9:00 AM  Result Value Ref Range   HM Dexa Scan Osteopenia        Pertinent labs & imaging results that were available during my care of the patient were reviewed by me and considered in my medical decision making.  Assessment & Plan:  Marissa Gomez was seen today for sore throat.  Diagnoses and all orders for this visit:  Pharyngitis, unspecified etiology -     azithromycin  (ZITHROMAX ) 250 MG tablet; Take 2 tablets on day 1, then 1 tablet daily on days 2 through 5     Assessment and Plan Marissa Gomez is a 78 yrs old Caucasian female seen today for pharyngitis, no acute distress Assessment & Plan Acute pharyngitis Persistent sore throat with mild cough and sinus headache.  No fever or significant inflammation. Unresponsive to initial symptomatic treatment. - Prescribed antibiotics: two tablets today, then one daily until completion. - Advised lozenges or hard candy for throat discomfort. - Sent prescription to [facility] pharmacy.      Continue all other maintenance medications.  Follow up plan: Return if symptoms worsen or fail to improve.   Continue healthy lifestyle choices, including diet (rich in fruits, vegetables, and lean proteins, and low in salt and simple carbohydrates) and exercise (at least 30 minutes of moderate physical activity daily).  Educational handout given for    Pharyngitis  Pharyngitis is a sore throat (pharynx). This is when there is redness, pain, and swelling in your throat. Most of the time, this condition gets better on its own. In some cases, you may need medicine. What are the causes? An infection from a virus. An infection from bacteria. Allergies. What increases the risk? Being 31-56 years old. Being in crowded environments. These include: Daycares. Schools. Dormitories. Living in a place with cold temperatures outside. Having a weakened disease-fighting (immune) system. What are the signs or symptoms? Symptoms may vary depending on the cause. Common symptoms include: Sore throat. Tiredness (fatigue). Low-grade fever. Stuffy nose. Cough. Headache. Other symptoms may include: Glands in the neck (lymph nodes) that are swollen. Skin rashes. Film on the throat or tonsils. This can be caused by an infection from bacteria. Vomiting. Red, itchy eyes. Loss of appetite. Joint pain and muscle aches. Tonsils that are temporarily bigger than usual (enlarged). How is this treated? Many times, treatment is not needed. This condition usually gets better in 3-4 days without treatment. If the infection is caused by a bacteria, you may be need to take antibiotics. Follow these instructions at home: Medicines Take  over-the-counter and prescription medicines only as told by your doctor. If you were prescribed an antibiotic  medicine, take it as told by your doctor. Do not stop taking the antibiotic even if you start to feel better. Use throat lozenges or sprays to soothe your throat as told by your doctor. Children can get pharyngitis. Do not give your child aspirin. Managing pain To help with pain, try: Sipping warm liquids, such as: Broth. Herbal tea. Warm water. Eating or drinking cold or frozen liquids, such as frozen ice pops. Rinsing your mouth (gargle) with a salt water mixture 3-4 times a day or as needed. To make salt water, dissolve -1 tsp (3-6 g) of salt in 1 cup (237 mL) of warm water. Do not swallow this mixture. Sucking on hard candy or throat lozenges. Putting a cool-mist humidifier in your bedroom at night to moisten the air. Sitting in the bathroom with the door closed for 5-10 minutes while you run hot water in the shower.   General instructions  Do not smoke or use any products that contain nicotine or tobacco. If you need help quitting, ask your doctor. Rest as told by your doctor. Drink enough fluid to keep your pee (urine) pale yellow. How is this prevented? Wash your hands often for at least 20 seconds with soap and water. If soap and water are not available, use hand sanitizer. Do not touch your eyes, nose, or mouth with unwashed hands. Wash hands after touching these areas. Do not share cups or eating utensils. Avoid close contact with people who are sick. Contact a doctor if: You have large, tender lumps in your neck. You have a rash. You cough up green, yellow-brown, or bloody spit. Get help right away if: You have a stiff neck. You drool or cannot swallow liquids. You cannot drink or take medicines without vomiting. You have very bad pain that does not go away with medicine. You have problems breathing, and it is not from a stuffy nose. You have new pain and  swelling in your knees, ankles, wrists, or elbows. These symptoms may be an emergency. Get help right away. Call your local emergency services (911 in the [place] Do not wait to see if the symptoms will go away. Do not drive yourself to the hospital. Summary Pharyngitis is a sore throat (pharynx). This is when there is redness, pain, and swelling in your throat. Most of the time, pharyngitis gets better on its own. Sometimes, you may need medicine. If you were prescribed an antibiotic medicine, take it as told by your doctor. Do not stop taking the antibiotic even if you start to feel better. This information is not intended to replace advice given to you by your health care provider. Make sure you discuss any questions you have with your health care provider. Document Revised: 02/21/2021 Document Reviewed: 02/21/2021 Elsevier Patient Education  2024 Elsevier Inc.     The above assessment and management plan was discussed with the patient. The patient verbalized understanding of and has agreed to the management plan. Patient is aware to call the clinic if they develop any new symptoms or if symptoms persist or worsen. Patient is aware when to return to the clinic for a follow-up visit. Patient educated on when it is appropriate to go to the emergency department.    Jaylan Hinojosa [place] Thompson, DNP [facility] [address] [place], [place] [address] [phone]

## 2024-10-08 ENCOUNTER — Ambulatory Visit (INDEPENDENT_AMBULATORY_CARE_PROVIDER_SITE_OTHER)

## 2024-10-08 DIAGNOSIS — Z23 Encounter for immunization: Secondary | ICD-10-CM

## 2024-12-15 ENCOUNTER — Ambulatory Visit: Payer: Self-pay

## 2024-12-15 NOTE — Telephone Encounter (Signed)
 FYI Only or Action Required?: Action required by provider: request for appointment and refused ED.  Patient was last seen in primary care on 09/22/2024 by Marissa Morton Sebastian Nena, NP.  Called Nurse Triage reporting Fever (Urinary symptom. ) and Fatigue.  Symptoms began a week ago.  Interventions attempted: OTC medications: Tylenol and Rest, hydration, or home remedies.  Symptoms are: gradually worsening.  Triage Disposition: Go to ED Now (or PCP Triage)  Patient/caregiver understands and will follow disposition?: No, wishes to speak with PCP    Copied from CRM #[id number]. Topic: Clinical - Red Word Triage >> Dec 15, 2024  4:52 PM Mercer PEDLAR wrote: Red Word that prompted transfer to Nurse Triage: weak, unable to control bladder, fever of 102. Has had symptoms for 3 days.     Reason for Disposition  [1] Decreased urination and [2] drinking very little AND [3] dehydration suspected (e.g., dark urine, no urine > 12 hours, very dry mouth, very lightheaded)    Very dry mouth (acute), can't get enough water in (thirsty).  Answer Assessment - Initial Assessment Questions TEMPERATURE: What is the most recent temperature?  How was it measured?      102 F this afternoon, stills feels having fever now.   ONSET: When did the fever start?      Yesterday  CHILLS: Do you have chills? If yes: How bad are they?  (e.g., none, mild, moderate, severe)     Yes, sitting in front of space heater.   OTHER SYMPTOMS: Do you have any other symptoms besides the fever?  (e.g., abdomen pain, cough, diarrhea, earache, headache, sore throat, urination pain)     Incontinence 3x yesterday. Denies pain or burning with urination. No blood. Week ago smelled something for couple days. Little tired.  No weakness or dizziness, no recent fall.  No trouble with mental clarity or hallucinations. Denies nausea/ vomiting. Can't seem to get enough to drink.   CAUSE: If there are no symptoms, ask: What do you  think is causing the fever?      Unsure  CONTACTS: Does anyone else in the family have an infection?     No recent known exposures.   TREATMENT: What have you done so far to treat this fever? (e.g., OTC fever medicines)     Tylenol and Tylenol PM; last took last night  IMMUNOCOMPROMISE: Do you have any of the following: diabetes, HIV positive, splenectomy, cancer chemotherapy, chronic steroid treatment, transplant patient, etc.?     Denies   TRAVEL: Have you traveled out of the country in the last month? (e.g., travel history, exposures) Denies  Answer Assessment - Initial Assessment Questions SYMPTOM: What's the main symptom you're concerned about? (e.g., frequency, incontinence)     Incontinence/ Urgency  OTHER SYMPTOMS: Do you have any other symptoms? (e.g., blood in urine, fever, flank pain, pain with urination)     Entire Lower Back pain earlier this afternoon, 4/10. History of sciatica, felt different however, more in back versus buttocks/ legs.  See alternative protocol (fever).  Protocols used: Fever-A-AH, Urinary Symptoms-A-AH

## 2024-12-16 ENCOUNTER — Ambulatory Visit: Admitting: Family Medicine

## 2024-12-16 VITALS — BP 117/61 | HR 110 | Temp 98.3°F | Ht 63.0 in | Wt 165.0 lb

## 2024-12-16 DIAGNOSIS — R350 Frequency of micturition: Secondary | ICD-10-CM

## 2024-12-16 DIAGNOSIS — N10 Acute tubulo-interstitial nephritis: Secondary | ICD-10-CM

## 2024-12-16 DIAGNOSIS — R509 Fever, unspecified: Secondary | ICD-10-CM

## 2024-12-16 LAB — MICROSCOPIC EXAMINATION
Renal Epithel, UA: NONE SEEN /HPF
Yeast, UA: NONE SEEN

## 2024-12-16 LAB — URINALYSIS, ROUTINE W REFLEX MICROSCOPIC
Bilirubin, UA: NEGATIVE
Glucose, UA: NEGATIVE
Ketones, UA: NEGATIVE
Nitrite, UA: NEGATIVE
Specific Gravity, UA: 1.015 (ref 1.005–1.030)
Urobilinogen, Ur: 0.2 mg/dL (ref 0.2–1.0)
pH, UA: 6.5 (ref 5.0–7.5)

## 2024-12-16 LAB — VERITOR SARS-COV-2 AND FLU A+B
BD Veritor SARS-CoV-2 Ag: NEGATIVE
Influenza A: NEGATIVE
Influenza B: NEGATIVE

## 2024-12-16 MED ORDER — CIPROFLOXACIN HCL 500 MG PO TABS: 500.0000 mg | ORAL_TABLET | Freq: Two times a day (BID) | ORAL | 0 refills | Status: AC

## 2024-12-16 MED ORDER — CEFTRIAXONE SODIUM 1 G IJ SOLR
1.0000 g | Freq: Once | INTRAMUSCULAR | Status: AC
  Administered 2024-12-16: 1 g via INTRAMUSCULAR

## 2024-12-16 NOTE — Progress Notes (Signed)
 "  Acute Office Visit  Patient ID: Marissa Gomez, female    DOB: [birth year], 79 y.o.   MRN: [id number]  PCP: Dettinger, Fonda LABOR, MD  Chief Complaint  Patient presents with   Fever    Patient reports having a fever for the past 3 days. Reports fever was worse yesterday (102 degrees). Patient has been taking OTC Tylenol to reduce fever.    Chills    Patient reports chills when she is running a fever.   Urinary Frequency    Patient reports urinary frequency has increased. Denies pain.    Subjective:     Fever   Urinary Frequency  Associated symptoms include frequency.    Discussed the use of AI scribe software for clinical note transcription with the patient, who gave verbal consent to proceed.  History of Present Illness   Marissa Gomez is an 79 year old female who presents with fever and urinary frequency.  Fever and constitutional symptoms - Fever for the past three days, with temperatures up to 102F - Fever typically rises towards the end of the day and is accompanied by chills - Temp today 100.2 - Has taken one tylenol today.  - No vomiting or abd pain.  - No upper respiratory symptoms such as cough, nasal congestion, or runny nose - Slightly scratchy throat, attributed to reduced food intake  Urinary symptoms - Increased urinary frequency x 3 days - No dysuria - Patient noticed a foul smell to her urine last week but had no symptoms and did not think much about it until the fever and increased urinary frequency started 3 days ago. - No recent urinary tract infection; last occurrence was a couple of years ago. - Reports that current symptoms are consistent with previous UTI symptoms  Musculoskeletal pain - History of sciatica - Increased low back pain compared to baseline       Review of Systems  Constitutional:  Positive for fever.  Genitourinary:  Positive for frequency.       Objective:    BP 117/61 (Cuff Size: Normal)   Pulse (!) 110   Temp 98.3  F (36.8 C)   Ht 5' 3 (1.6 m)   Wt 165 lb (74.8 kg)   SpO2 95%   BMI 29.23 kg/m    Physical Exam Vitals reviewed.  Constitutional:      Appearance: Normal appearance.     Comments: A&O x 4. No apparent distress and well appearing.   HENT:     Head: Normocephalic and atraumatic.  Eyes:     Extraocular Movements: Extraocular movements intact.     Conjunctiva/sclera: Conjunctivae normal.     Pupils: Pupils are equal, round, and reactive to light.  Cardiovascular:     Rate and Rhythm: Normal rate and regular rhythm.     Pulses: Normal pulses.     Heart sounds: Normal heart sounds. No murmur heard. Pulmonary:     Effort: Pulmonary effort is normal. No respiratory distress.     Breath sounds: Normal breath sounds.  Abdominal:     Palpations: Abdomen is soft.     Tenderness: There is no abdominal tenderness.     Comments: No CVA tenderness  Musculoskeletal:        General: Normal range of motion.     Cervical back: Normal range of motion.  Skin:    General: Skin is warm and dry.  Neurological:     General: No focal deficit present.     Mental  Status: She is alert and oriented to person, place, and time.  Psychiatric:        Mood and Affect: Mood normal.        Behavior: Behavior normal.       Results for orders placed or performed in visit on 12/16/24  Veritor SARS-CoV-2 and Flu A+B   Specimen: Nasal Swab   Nasal Swab  Result Value Ref Range   Influenza A Negative Negative   Influenza B Negative Negative   BD Veritor SARS-CoV-2 Ag Negative Negative  Microscopic Examination   Urine  Result Value Ref Range   WBC, UA 11-30 (A) 0 - 5 /hpf   RBC, Urine 0-2 0 - 2 /hpf   Epithelial Cells (non renal) 0-10 0 - 10 /hpf   Renal Epithel, UA None seen None seen /hpf   Bacteria, UA Few (A) None seen/Few   Yeast, UA None seen None seen  Urinalysis, Routine w reflex microscopic  Result Value Ref Range   Specific Gravity, UA 1.015 1.005 - 1.030   pH, UA 6.5 5.0 - 7.5    Color, UA Yellow Yellow   Appearance Ur Clear Clear   Leukocytes,UA 2+ (A) Negative   Protein,UA 2+ (A) Negative/Trace   Glucose, UA Negative Negative   Ketones, UA Negative Negative   RBC, UA 1+ (A) Negative   Bilirubin, UA Negative Negative   Urobilinogen, Ur 0.2 0.2 - 1.0 mg/dL   Nitrite, UA Negative Negative   Microscopic Examination See below:        Assessment & Plan:   Problem List Items Addressed This Visit   None Visit Diagnoses       Acute pyelonephritis    -  Primary   Relevant Medications   cefTRIAXone  (ROCEPHIN ) injection 1 g (Completed)   ciprofloxacin  (CIPRO ) 500 MG tablet     Urinary frequency       Relevant Orders   Urinalysis, Routine w reflex microscopic (Completed)   Urine Culture   Microscopic Examination (Completed)     Fever, unspecified fever cause       Relevant Orders   Veritor SARS-CoV-2 and Flu A+B (Completed)       Assessment and Plan    Fever and increased urinary urgency. - Flu and covid negative. - UA positive for leukocytes, RBC.  Negative nitrite. - Urine culture sent, results pending. - VS stable - Despite patient looking well overall on physical exam and afebrile at office today, will plan to treat for possible pyelonephritis due to reports of fever Tmax 102 and UA being consistent with possible UTI. - 1 g IM Rocephin  administered in office today and patient is to start Cipro  500 mg twice daily x 5 days - Patient to follow-up tomorrow in office for reevaluation.        Meds ordered this encounter  Medications   cefTRIAXone  (ROCEPHIN ) injection 1 g   ciprofloxacin  (CIPRO ) 500 MG tablet    Sig: Take 1 tablet (500 mg total) by mouth 2 (two) times daily for 5 days.    Dispense:  10 tablet    Refill:  0    Supervising Provider:   JOLINDA NORENE HERO [[id number]]    Return in 1 day (on 12/17/2024).  Oneil LELON Severin, FNP [facility]   "

## 2024-12-16 NOTE — Telephone Encounter (Signed)
 Noted. Patient is scheduled to be seen today for symptoms.

## 2024-12-17 ENCOUNTER — Ambulatory Visit (INDEPENDENT_AMBULATORY_CARE_PROVIDER_SITE_OTHER): Admitting: *Deleted

## 2024-12-17 VITALS — BP 109/67 | HR 109

## 2024-12-17 DIAGNOSIS — N10 Acute tubulo-interstitial nephritis: Secondary | ICD-10-CM

## 2024-12-17 NOTE — Progress Notes (Signed)
 B/P check  109/67  P - 109  O2sat  94%  Temp - 97.9

## 2024-12-21 ENCOUNTER — Ambulatory Visit: Admitting: Family Medicine

## 2024-12-21 VITALS — BP 110/66 | HR 95 | Temp 97.7°F | Ht 63.0 in | Wt 165.0 lb

## 2024-12-21 DIAGNOSIS — N10 Acute tubulo-interstitial nephritis: Secondary | ICD-10-CM

## 2024-12-21 LAB — URINALYSIS, ROUTINE W REFLEX MICROSCOPIC
Bilirubin, UA: NEGATIVE
Glucose, UA: NEGATIVE
Ketones, UA: NEGATIVE
Leukocytes,UA: NEGATIVE
Nitrite, UA: NEGATIVE
Protein,UA: NEGATIVE
RBC, UA: NEGATIVE
Specific Gravity, UA: 1.015 (ref 1.005–1.030)
Urobilinogen, Ur: 0.2 mg/dL (ref 0.2–1.0)
pH, UA: 7 (ref 5.0–7.5)

## 2024-12-21 NOTE — Progress Notes (Signed)
 "  Acute Office Visit  Patient ID: Marissa Gomez, female    DOB: [birth year], 79 y.o.   MRN: [id number]  PCP: Dettinger, Fonda LABOR, MD  Chief Complaint  Patient presents with   Follow-up    Patient reports that she is feeling much better since being seen on 12/16/2024. Will finish her last dose of Cipro  today. Denies having any symptoms today.     Subjective:     HPI  Discussed the use of AI scribe software for clinical note transcription with the patient, who gave verbal consent to proceed.  History of Present Illness   Marissa Gomez is an 79 year old female who presents for follow-up of a urinary tract infection (UTI).  Pyelonephritis  - Treated on December 16, 2024, for suspected pyelonephritis with IM Rocephin  injection followed by a five-day course of Ciprofloxacin .  - Urine culture positive for UTI; specific bacteria not yet identified, awaiting final results.  - Patient states that all symptoms have resolved.  Denies fevers.           ROS     Objective:    BP 110/66 (Cuff Size: Normal)   Pulse 95   Temp 97.7 F (36.5 C)   Ht 5' 3 (1.6 m)   Wt 165 lb (74.8 kg)   SpO2 96%   BMI 29.23 kg/m    Physical Exam Vitals reviewed.  Constitutional:      Appearance: Normal appearance.  HENT:     Head: Normocephalic and atraumatic.  Eyes:     Extraocular Movements: Extraocular movements intact.     Conjunctiva/sclera: Conjunctivae normal.     Pupils: Pupils are equal, round, and reactive to light.  Cardiovascular:     Rate and Rhythm: Normal rate and regular rhythm.     Pulses: Normal pulses.     Heart sounds: Normal heart sounds. No murmur heard. Pulmonary:     Effort: Pulmonary effort is normal. No respiratory distress.     Breath sounds: Normal breath sounds.  Abdominal:     Tenderness: There is no abdominal tenderness.  Musculoskeletal:        General: No deformity. Normal range of motion.     Cervical back: Normal range of motion.  Skin:     General: Skin is warm and dry.  Neurological:     General: No focal deficit present.     Mental Status: She is alert and oriented to person, place, and time.  Psychiatric:        Mood and Affect: Mood normal.        Behavior: Behavior normal.       No results found for any visits on 12/21/24.     Assessment & Plan:   Problem List Items Addressed This Visit   None Visit Diagnoses       Urinary frequency    -  Primary   Relevant Orders   Urinalysis, Routine w reflex microscopic   Urine Culture       Assessment and Plan    Acute pyelonephritis and urinary tract infection Treated with Rocephin  and Cipro  on 12/16/24. Symptoms resolved. Awaiting urine culture results for bacterial identification. - Await urine culture results to adjust antibiotics if necessary.  - UA today unremarkable. Repeat urine culture sent.  - Follow up if symptoms return, fever develops, or for any other concerns        No orders of the defined types were placed in this encounter.   Return if  symptoms worsen or fail to improve.  Marissa LELON Severin, FNP [facility]   "

## 2024-12-21 NOTE — Patient Instructions (Addendum)
 Follow up if symptoms return, fever develops, or for any other concerns

## 2024-12-22 ENCOUNTER — Ambulatory Visit: Payer: Self-pay | Admitting: Family Medicine

## 2024-12-22 LAB — URINE CULTURE

## 2024-12-23 ENCOUNTER — Ambulatory Visit: Payer: Self-pay | Admitting: Family Medicine

## 2024-12-23 LAB — URINE CULTURE

## 2025-06-13 ENCOUNTER — Ambulatory Visit: Payer: Self-pay
